# Patient Record
Sex: Female | Born: 1950 | Race: White | Hispanic: No | Marital: Married | Smoking: Never smoker
Health system: Southern US, Community
[De-identification: ages and names within clinical notes are randomized; demographics above are authoritative.]

## PROBLEM LIST (undated history)

## (undated) DIAGNOSIS — IMO0002 Reserved for concepts with insufficient information to code with codable children: Secondary | ICD-10-CM

## (undated) DIAGNOSIS — Z889 Allergy status to unspecified drugs, medicaments and biological substances status: Secondary | ICD-10-CM

## (undated) DIAGNOSIS — Z8669 Personal history of other diseases of the nervous system and sense organs: Secondary | ICD-10-CM

## (undated) DIAGNOSIS — C801 Malignant (primary) neoplasm, unspecified: Secondary | ICD-10-CM

## (undated) DIAGNOSIS — T7840XA Allergy, unspecified, initial encounter: Secondary | ICD-10-CM

## (undated) DIAGNOSIS — K5790 Diverticulosis of intestine, part unspecified, without perforation or abscess without bleeding: Secondary | ICD-10-CM

## (undated) DIAGNOSIS — M1990 Unspecified osteoarthritis, unspecified site: Secondary | ICD-10-CM

## (undated) HISTORY — PX: TOTAL ABDOMINAL HYSTERECTOMY W/ BILATERAL SALPINGOOPHORECTOMY: SHX83

## (undated) HISTORY — PX: ABDOMINAL HYSTERECTOMY: SHX81

## (undated) HISTORY — PX: JOINT REPLACEMENT: SHX530

## (undated) HISTORY — PX: KNEE ARTHROSCOPY: SHX127

## (undated) HISTORY — DX: Allergy, unspecified, initial encounter: T78.40XA

## (undated) HISTORY — DX: Diverticulosis of intestine, part unspecified, without perforation or abscess without bleeding: K57.90

---

## 1997-04-26 ENCOUNTER — Inpatient Hospital Stay (HOSPITAL_COMMUNITY): Admission: AD | Admit: 1997-04-26 | Discharge: 1997-04-28 | Payer: Self-pay | Admitting: Obstetrics and Gynecology

## 1999-02-08 ENCOUNTER — Other Ambulatory Visit: Admission: RE | Admit: 1999-02-08 | Discharge: 1999-02-08 | Payer: Self-pay

## 1999-02-08 ENCOUNTER — Other Ambulatory Visit: Admission: RE | Admit: 1999-02-08 | Discharge: 1999-02-08 | Payer: Self-pay | Admitting: Obstetrics and Gynecology

## 1999-03-01 ENCOUNTER — Encounter: Payer: Self-pay | Admitting: Obstetrics and Gynecology

## 1999-03-06 ENCOUNTER — Encounter (INDEPENDENT_AMBULATORY_CARE_PROVIDER_SITE_OTHER): Payer: Self-pay | Admitting: Specialist

## 1999-03-06 ENCOUNTER — Inpatient Hospital Stay (HOSPITAL_COMMUNITY): Admission: RE | Admit: 1999-03-06 | Discharge: 1999-03-08 | Payer: Self-pay | Admitting: Obstetrics and Gynecology

## 1999-04-29 ENCOUNTER — Encounter: Payer: Self-pay | Admitting: Obstetrics and Gynecology

## 1999-04-29 ENCOUNTER — Ambulatory Visit (HOSPITAL_COMMUNITY): Admission: RE | Admit: 1999-04-29 | Discharge: 1999-04-29 | Payer: Self-pay | Admitting: Obstetrics and Gynecology

## 1999-05-21 ENCOUNTER — Ambulatory Visit (HOSPITAL_COMMUNITY): Admission: RE | Admit: 1999-05-21 | Discharge: 1999-05-21 | Payer: Self-pay | Admitting: Internal Medicine

## 1999-06-11 ENCOUNTER — Ambulatory Visit: Admission: RE | Admit: 1999-06-11 | Discharge: 1999-06-11 | Payer: Self-pay | Admitting: Gynecology

## 1999-12-09 ENCOUNTER — Other Ambulatory Visit: Admission: RE | Admit: 1999-12-09 | Discharge: 1999-12-09 | Payer: Self-pay | Admitting: Obstetrics and Gynecology

## 2000-02-13 ENCOUNTER — Encounter: Payer: Self-pay | Admitting: Internal Medicine

## 2000-02-13 ENCOUNTER — Encounter: Admission: RE | Admit: 2000-02-13 | Discharge: 2000-02-13 | Payer: Self-pay | Admitting: Internal Medicine

## 2000-05-28 ENCOUNTER — Other Ambulatory Visit: Admission: RE | Admit: 2000-05-28 | Discharge: 2000-05-28 | Payer: Self-pay | Admitting: Obstetrics and Gynecology

## 2001-06-29 ENCOUNTER — Other Ambulatory Visit: Admission: RE | Admit: 2001-06-29 | Discharge: 2001-06-29 | Payer: Self-pay | Admitting: Obstetrics and Gynecology

## 2002-07-04 ENCOUNTER — Other Ambulatory Visit: Admission: RE | Admit: 2002-07-04 | Discharge: 2002-07-04 | Payer: Self-pay | Admitting: Obstetrics and Gynecology

## 2003-05-11 ENCOUNTER — Encounter: Admission: RE | Admit: 2003-05-11 | Discharge: 2003-05-11 | Payer: Self-pay | Admitting: Internal Medicine

## 2003-11-13 ENCOUNTER — Ambulatory Visit (HOSPITAL_COMMUNITY): Admission: RE | Admit: 2003-11-13 | Discharge: 2003-11-13 | Payer: Self-pay | Admitting: Gastroenterology

## 2005-05-19 ENCOUNTER — Ambulatory Visit (HOSPITAL_BASED_OUTPATIENT_CLINIC_OR_DEPARTMENT_OTHER): Admission: RE | Admit: 2005-05-19 | Discharge: 2005-05-19 | Payer: Self-pay | Admitting: Orthopedic Surgery

## 2006-11-05 ENCOUNTER — Encounter: Admission: RE | Admit: 2006-11-05 | Discharge: 2006-11-05 | Payer: Self-pay | Admitting: *Deleted

## 2006-11-08 ENCOUNTER — Encounter: Admission: RE | Admit: 2006-11-08 | Discharge: 2006-11-08 | Payer: Self-pay | Admitting: *Deleted

## 2006-12-24 ENCOUNTER — Encounter: Admission: RE | Admit: 2006-12-24 | Discharge: 2006-12-24 | Payer: Self-pay | Admitting: Orthopedic Surgery

## 2007-06-16 ENCOUNTER — Inpatient Hospital Stay (HOSPITAL_COMMUNITY): Admission: RE | Admit: 2007-06-16 | Discharge: 2007-06-19 | Payer: Self-pay | Admitting: Orthopedic Surgery

## 2007-06-16 IMAGING — CR DG HIP 1V*R*
1 series · 1 of 1 positions shown · non-contrast
Comparison: [DATE]

CLINICAL DATA: 56-year-old female.  Status post right hip total
arthroplasty.

RIGHT HIP - 1 VIEW

[view not recorded]
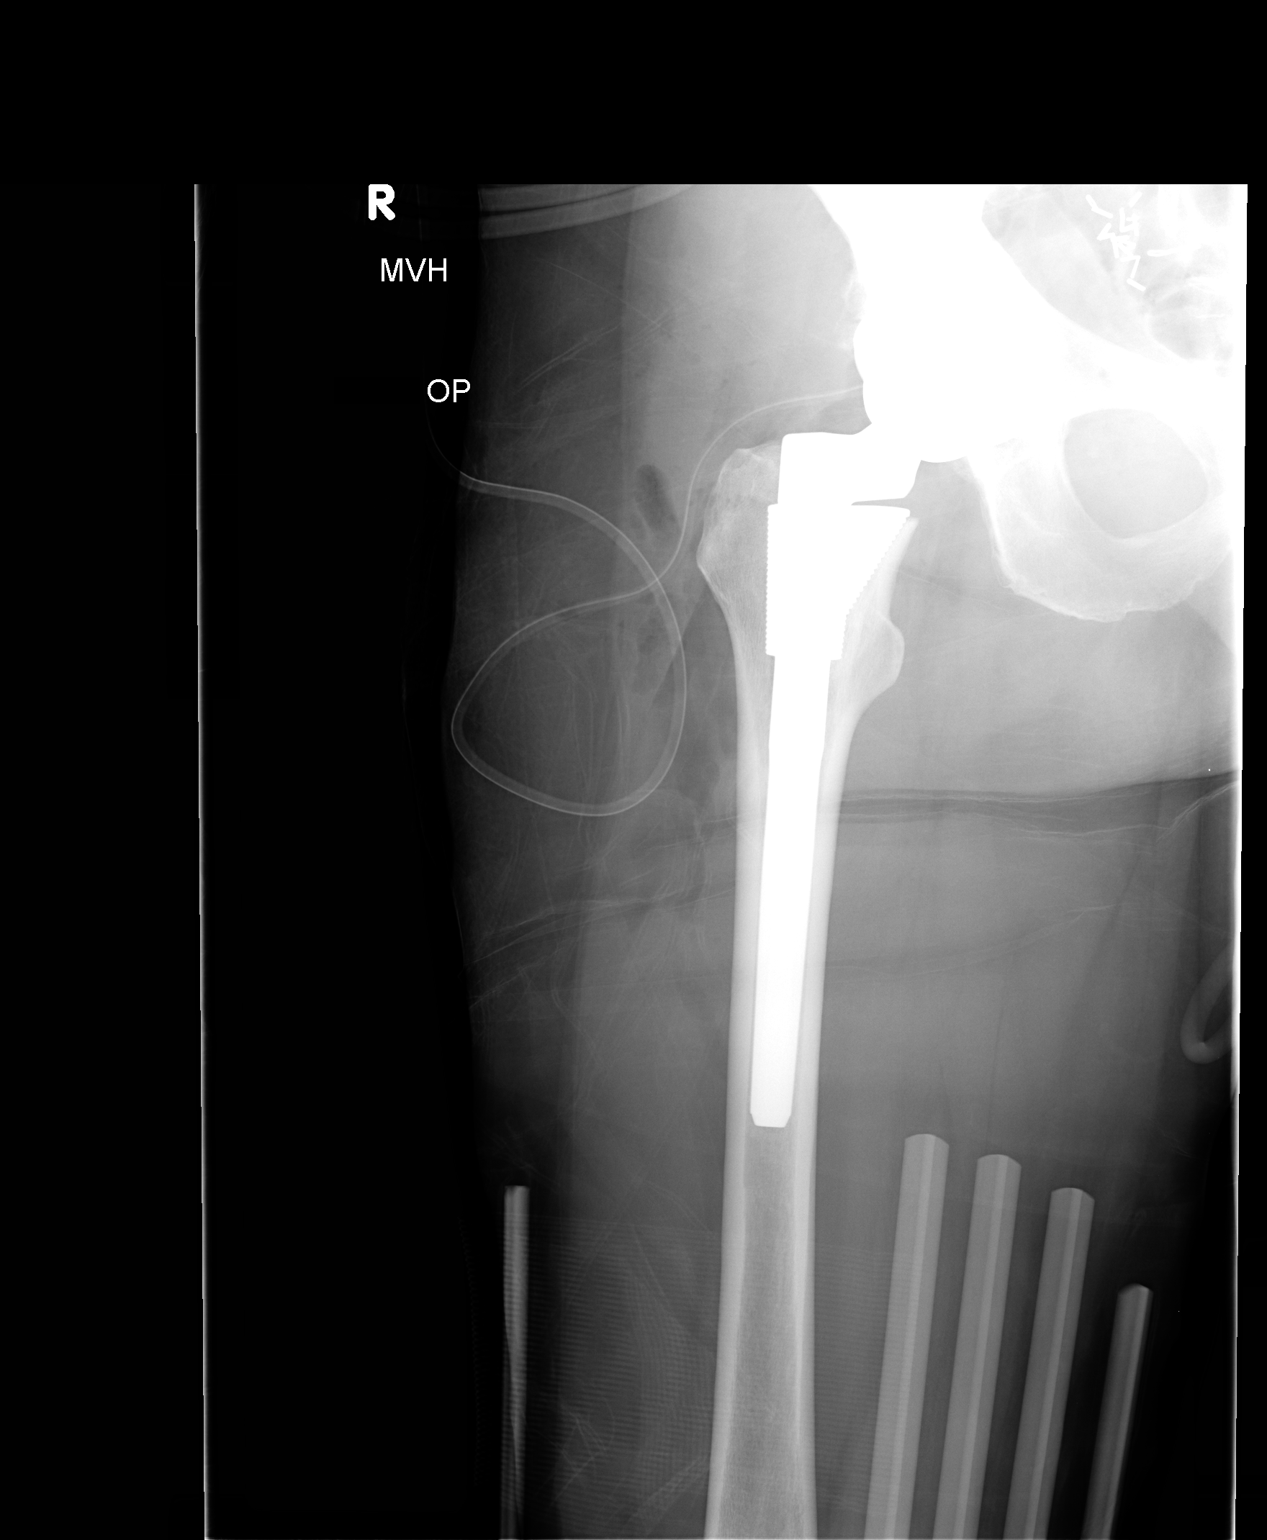

[1 of 1 positions shown; findings below may reference images not displayed]

FINDINGS: Single AP view the right hip demonstrates the patient is
status post right total hip arthroplasty with a noncemented
acetabular component.  Surgical drain is in place.  There is no
radiographic evidence for complication.
IMPRESSION: 1.  Status post right total hip arthroplasty without radiographic
evidence for complication.

## 2008-06-13 ENCOUNTER — Encounter: Admission: RE | Admit: 2008-06-13 | Discharge: 2008-06-13 | Payer: Self-pay | Admitting: Family Medicine

## 2010-05-28 NOTE — H&P (Signed)
[name], [name] NO.:  [id number]   MEDICAL RECORD NO.:  [id number]          PATIENT TYPE:  INP   LOCATION:  NA                           FACILITY:  [facility]   PHYSICIAN:  Ollen Gross, M.D.    DATE OF BIRTH:  [birth year]   DATE OF ADMISSION:  06/16/2007  DATE OF DISCHARGE:                              HISTORY & PHYSICAL   DATE OF OFFICE VISIT HISTORY AND PHYSICAL:  Performed on May 20, 2007.   CHIEF COMPLAINT:  Right hip pain.   HISTORY OF PRESENT ILLNESS:  The patient is a 60 year old female who has  seen by Dr. Lequita Halt for ongoing right hip pain.  She has known  significant arthritis and has been followed for some time now.  She has  known bone-on-bone arthritis with cystic changes in the head and  acetabulum.  She has undergone intra-articular injections but only  provided temporary relief.  She is at a point now where she is having  increasing pain felt to be a good candidate for hip replacement.  Risks  and benefits have been discussed.  She elects to proceed with surgery.   ALLERGIES:  NO KNOWN DRUG ALLERGIES.   INTOLERANCES:  1. FENTANYL causes nausea.  2. PERCOCET causes nausea.   CURRENT MEDICATIONS:  1. Flonase.  2. Astelin.  3. Vivelle dot.  4. Vicodin.   PAST MEDICAL HISTORY:  1. Postmenopausal.  2. Hip arthritis.   PAST SURGICAL HISTORY:  1. Oophorectomy in 1999.  2. Hysterectomy in 2001.  3. Meniscus surgery in 2007.   SOCIAL HISTORY:  Married.  Self-employed Engineer, agricultural.  Nonsmoker.  One  and a half glasses of wine daily.  Family will be assisting with care  after surgery.   REVIEW OF SYSTEMS:  GENERAL:  No fevers, chills, night sweats.  NEUROLOGIC:  No seizures, syncope or paralysis.  RESPIRATORY:  No  shortness breath, productive cough or hemoptysis.  CARDIOVASCULAR:  No  chest pain, angina, orthopnea.  GI:  No nausea, vomiting or diarrhea.  She does have some constipation.  GU:  No dysuria, hematuria or  discharge.   MUSCULOSKELETAL:  Right hip.   PHYSICAL EXAMINATION:  VITAL SIGNS:  Pulse 68, respirations 14, blood  pressure 112/72.  GENERAL:  A 61 year old white female, well-nourished, well-developed, in  no acute distress.  She is alert, oriented and cooperative.  HEENT:  Normocephalic, atraumatic.  Pupils are round and reactive.  Oropharynx is clear.  Extraocular movements intact.  NECK:  Supple.  CHEST:  Clear.  HEART:  Regular rate and rhythm.  ABDOMEN:  Soft, nontender.  Bowel sounds present.  RECTAL/BREAST/GENITALIA:  Not done.  Not pertinent to present illness.  EXTREMITIES:  Right hip flexion 95, zero internal rotation, 10 degrees  external rotation, 20 degrees abduction.   IMPRESSION:  Osteoarthritis, right hip.   PLAN:  The patient admitted to [facility] to undergo a right  total hip replacement arthroplasty.  Surgery will be performed by Dr.  Ollen Gross.      Alexzandrew L. Perkins, P.A.C.      Ollen Gross, M.D.  Electronically Signed    ALP/MEDQ  D:  06/15/2007  T:  06/16/2007  Job:  [id number]   cc:   Georgena Spurling, [facility]

## 2010-05-28 NOTE — Op Note (Signed)
NAMEKATELYNNE, REVAK NO.:  [id number]   MEDICAL RECORD NO.:  [id number]          PATIENT TYPE:  INP   LOCATION:  1614                         FACILITY:  [facility]   PHYSICIAN:  Ollen Gross, M.D.    DATE OF BIRTH:  27-May-1950   DATE OF PROCEDURE:  06/16/2007  DATE OF DISCHARGE:                               OPERATIVE REPORT   PREOPERATIVE DIAGNOSIS:  Osteoarthritis with acetabular dysplasia, right  hip.   POSTOPERATIVE DIAGNOSIS:  Osteoarthritis with acetabular dysplasia,  right hip.   PROCEDURE:  Right total hip arthroplasty.   SURGEON:  Ollen Gross, M.D.   ASSISTANT:  Avel Peace, P.A.-C.   ANESTHESIA:  Spinal with Duramorph.   ESTIMATED BLOOD LOSS:  100.   DRAINS:  Hemovac x1.   COMPLICATIONS:  None.   CONDITION:  Stable to recovery.   BRIEF CLINICAL NOTE:  Ms. Milner is a 60 year old female who has severe  arthritic change of the right hip with underlying acetabular femoral  head dysplasia.  She has had long-term worsening of her condition and  presents now for total hip arthroplasty.   PROCEDURE IN DETAIL:  After successful administration of spinal  anesthetic, the patient is placed in left lateral decubitus position  with the right side up and held the hip positioner.  The right lower  extremity was isolated from the perineum with plastic drapes and prepped  and draped in the usual sterile fashion.  Short posterolateral incision  was made with 10 blade through the subcutaneous tissue to the level of  fascia lata which was incised in line with the skin incision.  The  sciatic nerve was palpated and protected, and short external rotator was  isolated off the femur.  Capsulectomy was performed, and the hip was  dislocated.  The center of femoral head was marked, and trial prosthesis  was placed such that the center of the trial head corresponded to the  center of her native femoral head.  Osteotomy line was marked on the  femoral neck, and  osteotomy made with an oscillating saw.  Femoral head  was removed, and the femur retracted anteriorly to gain acetabular  exposure.   Acetabular retractors were placed and labrum and osteophytes removed.  Reaming starts at 43 mm coursing in increments of 2 to 49 mm, and then a  50-mm pinnacle acetabular shell was placed in anatomic position with  excellent purchase.  The apex hole eliminator was placed, and then the  36-mm neutral Ultamet metal liner was placed for a metal-on-metal hip  replacement.   The femur was prepared the canal finder and irrigation.  Axial reaming  was performed up to 13.5 mm, proximal reaming to 18B, and the sleeve  machined to a large.  An 18D large trial sleeve was placed with 18 x 13  stem and 36 plus 8 neck about 10 degrees beyond her native anteversion.  The trial 36 plus 0 head was placed, and the hip was reduced with  outstanding stability.  There was full extension, full external  rotation, 70 degrees of flexion, 40 degrees of adduction and  90 degrees  of internal rotation and 90 degrees of flexion and 70 degrees of  internal rotation.  By placing the right leg on top of the left, it felt  as though the leg lengths were equal.  The hip was then dislocated and  trials removed.  A permanent 18D large sleeve was placed with the 18 x  13 stem and 36 plus 8, again 10 degrees beyond native anteversion.  A 36  plus 0 head was placed, and the hip was reduced with the same stability  parameters.  The wound was copiously irrigated with saline solution and  the short rotators reattached to the femur through drill holes.  Fascia  was closed over a Hemovac drain with interrupted #1 Vicryl.  Subcutaneous tissue closed with #1-0 and #2-0 Vicryl and subcuticular  running 4-0 Monocryl.  The drain was hooked to suction.  Incision was  cleaned and dried, and Steri-Strips and a bulky sterile dressing  applied.  She was then placed into a knee immobilizer, awakened and   transported to recovery in stable condition.      Ollen Gross, M.D.  Electronically Signed     FA/MEDQ  D:  06/16/2007  T:  06/16/2007  Job:  [id number]

## 2010-05-31 NOTE — Discharge Summary (Signed)
NAMEKIEANNA, ROLLO                ACCOUNT NO.:  [id number]   MEDICAL RECORD NO.:  [id number]          PATIENT TYPE:  INP   LOCATION:  1614                         FACILITY:  [facility]   PHYSICIAN:  Ollen Gross, M.D.    DATE OF BIRTH:  21-Feb-1950   DATE OF ADMISSION:  06/16/2007  DATE OF DISCHARGE:  06/19/2007                               DISCHARGE SUMMARY   ADMITTING DIAGNOSES:  1. Osteoarthritis, right hip.  2. Postmenopausal.   DISCHARGE DIAGNOSES:  1. Osteoarthritis, right hip, with acetabular dysplasia.  2. Postmenopausal.   PROCEDURE:  June 16, 2007 right total hip.  Surgeon - Dr. Lequita Halt.  Assistant - Avel Peace, P.A.C.  Spinal anesthesia with Duramorph.   CONSULTATIONS:  None.   BRIEF HISTORY:  Ms. Palecek is a 60 year old female with severe end-stage  arthritis of the right hip with progressive worsening pain and  dysfunction.  She had some underlying acetabular femoral head dysplasia,  worsening symptoms, who now presents for a total hip.   LABORATORY DATA:  Preoperative CBC showed a hemoglobin of 13.5,  hematocrit 39.5, white cell count 4.0, platelets 325.  Postoperative  hemoglobin 12.3, drifted down to 11, back up to 11.2 with a hematemesis  of 32.5.  PT/PTT preoperatively 13.1 and 30 respectively.  INR 1.0.  Serial pro times followed.  PT/INR 19.2 and 1.6.  Chem panel on  admission minimally elevated CO2 of 33.  Remaining Chem panel within  normal limits.  Serial BMETs were followed.  Electrolytes remained  within normal limits.  Preoperative UA negative.  Blood group type A+.  EKG on Jun 10, 2007 revealed normal sinus rhythm, normal EKG,  unconfirmed.  Hip film on Jun 10, 2007 stable, asymmetric.  Degenerate  changes, right hip, with prominence of chondral cyst in the right  acetabulum root with no acute osseous findings.   HOSPITAL COURSE:  The patient was admitted to [facility]  Tolerated the procedure well and later transferred to the recovery  room  and the orthopedic floor.  Started on PCA and p.o. analgesic pain  control following surgery.  Hemovac drain placed during surgery was  pulled on day one.  Given 24 hours of postoperative IV antibiotics,  started on Coumadin for DVT prophylaxis, Coumadin protocol.  Actually  doing pretty well on the morning of day one.  Started getting up out of  bed.  By day #2, was a little sore, but still doing pretty well on pain  control.  Hemoglobin was 11.  Dressing changed; the incision looked  good.  Had excellent output.  From a therapy standpoint, she started  getting up, walking short distances and continued to improve; by day #3  walking over 60 feet.  Had been weaned over to p.m. medications.  Arrangements were made for home.  The wound was healing well, and the  patient was discharged home later that day.   DISCHARGE/PLAN:  1. The patient was discharged home on June 19, 2007.  2. Discharge diagnoses - please see above.  3. Discharge medications - Percocet, Robaxin, Coumadin.   ACTIVITY:  Partial weightbearing  25-50%, right lower extremity.  Home  health PT, home health nursing, total hip protocol with hip precautions.   FOLLOWUP:  Follow up on June 29, 2007 on Tuesday; call for appointment.   DISPOSITION:  Home.   CONDITION ON DISCHARGE:  Improving.      Alexzandrew L. Perkins, P.A.C.      Ollen Gross, M.D.  Electronically Signed    ALP/MEDQ  D:  07/14/2007  T:  07/14/2007  Job:  [id number]   cc:   Simeon Craft(?), N.P.  [facility]

## 2010-05-31 NOTE — Op Note (Signed)
Martha, Garcia NO.:  [id number]   MEDICAL RECORD NO.:  [id number]          PATIENT TYPE:  AMB   LOCATION:  ENDO                         FACILITY:  [facility]   PHYSICIAN:  Graylin Shiver, M.D.   DATE OF BIRTH:  September 30, 1950   DATE OF PROCEDURE:  DATE OF DISCHARGE:  11/13/2003                                 OPERATIVE REPORT   PROCEDURE:  Colonoscopy.   ENDOSCOPIST:  Graylin Shiver, M.D.   INDICATIONS:  Screening.   INFORMED CONSENT:  Informed consent was obtained after explanation of the  risks of bleeding, infection and perforation.   PREMEDICATION:  Fentanyl 50 mcg IV, Versed 6 mg IV.   DESCRIPTION OF PROCEDURE:  With the patient in the left lateral decubitus  position, a rectal exam was performed and no masses were felt.  The Olympus  colonoscope was inserted into the rectum and advanced around the colon to  the cecum.  The cecal landmarks were identified.  The cecum and ascending  colon were normal.  The transverse colon was normal.  The descending colon,  sigmoid and rectum were normal.  She tolerated the procedure well without  complications.   IMPRESSION:  Normal colonoscopy to the cecum.   RECOMMENDATIONS:  I recommend a followup screening colonoscopy again in 10  years.      SFG/MEDQ  D:  11/13/2003  T:  11/13/2003  Job:  [id number]   cc:   Darius Bump, M.D.  [place]  [place] [address]  Fax: [phone]

## 2010-05-31 NOTE — Consult Note (Signed)
[facility]  Patient:    [name], [name]                       MRN: [id number] Proc. Date: 06/11/99 Adm. Date:  [id number] Attending:  Jeannette Corpus CC:         Eliberto Ivory. Rosalio Macadamia, M.D.             Pershing Cox, M.D.             Telford Nab, R.N.                          Consultation Report  HISTORY OF PRESENT ILLNESS:  A 60 year old white female referred by Dr. Gildardo Griffes and Floyde Parkins for a second opinion regarding management of an unusual pelvic tumor. The patients history is outlined in detail and Dr. Theodosia Blender consultation note of April 24. Basically, the patient had been followed with a relatively small (4 cm) pelvic cyst which is thought to be adnexal in origin. She ultimately underwent surgery on April 21 with the finding of a left adnexal mass measuring 3.1 x 4.2 cm. The patient underwent a left salpingo-oophorectomy, the left tube and ovary of which were stuck to the posterior wall of the uterus and peritoneum. 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 The rectum was also adherent to the posterior surface of the uterus requiring extensive dissection. She underwent a supracervical hysterectomy. Subsequently a mass was found deeper in the pelvis in the left side. Left ureterolysis was required to protect the ureter and free the mass from what sounds like a perirectal space. 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  The pathology had been reviewed by Dr. Ursula Beath at [facility].  The patient seeks consultation in helping to develop and understanding of the etiology of this mass. We also discussed that there is probably a low recurrence rate if we can extrapolate from borderline tumors of the ovary. Given this borderline nature, I do not believe additional surgical staging would be of any significant  value. I indicated, I agreed with Dr. Theodosia Blender recommendation to obtain a pelvic ultrasound once a year and have twice a year pelvic examinations. (The patients had an MRI on April 16 which was normal and may be used as a baseline although I would not routinely plan follow-up MRIs unless she had specific symptomatology to evaluate.)  The pros and cons of hormone replacement therapy were discussed and I am concurrence with the idea of continuing the patient on Estratest. We discussed the pros and cons of chemotherapy if she were to have a recurrence and I indicated that surgical resection would be the treatment of choice in this setting of a borderline tumor. She has been given warning signs regarding potential symptoms of recurrence. All of her questions are answered. She will return to the care of Dr. Rosalio Macadamia. DD:  06/11/99 TD:  06/11/99 Job: [id number]

## 2010-05-31 NOTE — Op Note (Signed)
[name], [name]                ACCOUNT NO.:  [id number]   MEDICAL RECORD NO.:  [id number]          PATIENT TYPE:  AMB   LOCATION:  DSC                          FACILITY:  [facility]   PHYSICIAN:  Robert A. Thurston Hole, M.D. DATE OF BIRTH:  28-Sep-1950   DATE OF PROCEDURE:  05/19/2005  DATE OF DISCHARGE:                                 OPERATIVE REPORT   PREOPERATIVE DIAGNOSIS:  Left knee medial and lateral meniscal tears with  chondromalacia and synovitis.   POSTOPERATIVE DIAGNOSIS:  Left knee medial and lateral meniscal tears with  chondromalacia and synovitis.   PROCEDURE:  1.  Left knee exam under anesthesia followed by arthroscopic partial medial      and lateral meniscectomies.  2.  Left knee chondroplasty with partial synovectomy.   SURGEON:  Elana Alm. Thurston Hole, M.D.   ASSISTANT:  Julien Girt, P.A.   ANESTHESIA:  Local and MAC.   OPERATIVE TIME:  30 minutes.   COMPLICATIONS:  None.   INDICATIONS FOR PROCEDURE:  [name] is a 54-year woman who has had a  twisting injury to her left knee approximately three months ago with  increased pain and swelling with exam and MRI documenting medial and lateral  meniscal tears with chondromalacia and synovitis.  She failed conservative  care and is now to undergo arthroscopy.   DESCRIPTION:  [name] was brought to operating room on May 19, 2005 after a  knee block was placed in the holding room by anesthesia.  She was placed on  the operative table in supine position.  Her left knee was examined under  anesthesia.  She had full range of motion, and her knee was stable  ligamentous exam with normal patellar tracking.  Left leg was prepped using  sterile DuraPrep and draped using sterile technique.  Originally, through an  anterolateral portal, the arthroscope with a pump attached was placed into  an anteromedial portal, and an arthroscopic probe was placed.  On initial  inspection of the medial compartment, she had 30% grade 3  chondromalacia  which was debrided.  Medial meniscus tear of posteromedial and medial horn  of which 40-50% was resected back to stable rim.  Intercondylar notch  inspected and anterior and posterior cruciate ligaments were normal.  Lateral compartment inspected. Articular cartilage showed mild grade 1 to 2  chondromalacia.  Lateral meniscus tear posterior and lateral horn 25 to 30%  which was resected back to stable rim.  Patellofemoral joint and articular  cartilage normal.  The patella tracked normally.  Moderate synovitis in the  medial and lateral gutters were debrided.  Otherwise, they are free of  pathology.  After this was done, it was felt that all pathology had been  satisfactorily addressed.  Instruments were removed.  Portals were closed  with 3-0 nylon suture and injected with 0.2% Marcaine with epinephrine and 4  mg of morphine.  Sterile dressings applied.  The patient awakened and taken  to recovery in stable condition.   FOLLOW-UP CARE:  [name] will be followed as an outpatient on Percocet and  ibuprofen.  She will  be back in the office in a week for sutures out and  follow-up.      Robert A. Thurston Hole, M.D.  Electronically Signed     RAW/MEDQ  D:  05/19/2005  T:  05/20/2005  Job:  [id number]

## 2010-10-09 LAB — COMPREHENSIVE METABOLIC PANEL
AST: 21
Albumin: 3.7
BUN: 11
Calcium: 9.6
GFR calc Af Amer: >60
GFR calc non Af Amer: >60
Potassium: 4.7
Total Bilirubin: 0.8
Total Protein: 6.7

## 2010-10-09 LAB — PROTIME-INR
INR: 1
Prothrombin Time: 13.1

## 2010-10-09 LAB — URINALYSIS, ROUTINE W REFLEX MICROSCOPIC: Hgb urine dipstick: NEGATIVE

## 2010-10-09 LAB — CBC
MCHC: 34.2
Platelets: 325
RBC: 4.21

## 2010-10-10 LAB — BASIC METABOLIC PANEL
BUN: 4 — ABNORMAL LOW
BUN: 7
CO2: 29
CO2: 31
Calcium: 8.6
Calcium: 8.9
Chloride: 104
Creatinine, Ser: 0.77
GFR calc non Af Amer: >60
GFR calc non Af Amer: >60
Sodium: 137

## 2010-10-10 LAB — CBC
HCT: 32.5 — ABNORMAL LOW
Hemoglobin: 11.2 — ABNORMAL LOW
MCHC: 34.5
MCHC: 34.6
MCV: 94.1
MCV: 95.2
Platelets: 212
Platelets: 223
RBC: 3.41 — ABNORMAL LOW
RBC: 3.46 — ABNORMAL LOW
RBC: 3.8 — ABNORMAL LOW
RDW: 12.2
RDW: 13

## 2010-10-10 LAB — PROTIME-INR
INR: 1.1
INR: 1.5
INR: 1.6 — ABNORMAL HIGH
Prothrombin Time: 14.3
Prothrombin Time: 19.2 — ABNORMAL HIGH

## 2010-10-10 LAB — TYPE AND SCREEN: ABO/RH(D): A POS

## 2013-04-22 ENCOUNTER — Encounter: Payer: Self-pay | Admitting: Internal Medicine

## 2013-06-17 ENCOUNTER — Other Ambulatory Visit: Payer: Self-pay | Admitting: Orthopedic Surgery

## 2013-06-22 ENCOUNTER — Encounter: Payer: Self-pay | Admitting: Internal Medicine

## 2013-06-24 ENCOUNTER — Encounter (HOSPITAL_COMMUNITY): Payer: Self-pay | Admitting: Pharmacy Technician

## 2013-06-29 ENCOUNTER — Ambulatory Visit (HOSPITAL_COMMUNITY)
Admission: RE | Admit: 2013-06-29 | Discharge: 2013-06-29 | Disposition: A | Discharge status: Home / Self Care | Payer: 59 | Source: Ambulatory Visit | Attending: Orthopedic Surgery | Admitting: Orthopedic Surgery

## 2013-06-29 ENCOUNTER — Encounter (HOSPITAL_COMMUNITY)
Admission: RE | Admit: 2013-06-29 | Discharge: 2013-06-29 | Disposition: A | Discharge status: Home / Self Care | Payer: 59 | Source: Ambulatory Visit | Attending: Orthopedic Surgery | Admitting: Orthopedic Surgery

## 2013-06-29 ENCOUNTER — Encounter (HOSPITAL_COMMUNITY): Payer: Self-pay

## 2013-06-29 DIAGNOSIS — Y831 Surgical operation with implant of artificial internal device as the cause of abnormal reaction of the patient, or of later complication, without mention of misadventure at the time of the procedure: Secondary | ICD-10-CM | POA: Insufficient documentation

## 2013-06-29 DIAGNOSIS — T84099A Other mechanical complication of unspecified internal joint prosthesis, initial encounter: Secondary | ICD-10-CM | POA: Insufficient documentation

## 2013-06-29 HISTORY — DX: Personal history of other diseases of the nervous system and sense organs: Z86.69

## 2013-06-29 HISTORY — DX: Unspecified osteoarthritis, unspecified site: M19.90

## 2013-06-29 HISTORY — DX: Allergy status to unspecified drugs, medicaments and biological substances: Z88.9

## 2013-06-29 HISTORY — DX: Malignant (primary) neoplasm, unspecified: C80.1

## 2013-06-29 HISTORY — DX: Reserved for concepts with insufficient information to code with codable children: IMO0002

## 2013-06-29 LAB — URINE MICROSCOPIC-ADD ON

## 2013-06-29 LAB — COMPREHENSIVE METABOLIC PANEL
ALBUMIN: 4.1 g/dL (ref 3.5–5.2)
ALT: 15 U/L (ref 0–35)
AST: 24 U/L (ref 0–37)
Alkaline Phosphatase: 72 U/L (ref 39–117)
BUN: 13 mg/dL (ref 6–23)
CO2: 28 mEq/L (ref 19–32)
CREATININE: 0.75 mg/dL (ref 0.50–1.10)
Calcium: 10 mg/dL (ref 8.4–10.5)
Chloride: 102 mEq/L (ref 96–112)
GFR calc Af Amer: >90 mL/min (ref >90)
GFR calc non Af Amer: >90 mL/min (ref >90)
Glucose, Bld: 94 mg/dL (ref 70–99)
Potassium: 4.5 mEq/L (ref 3.7–5.3)
Sodium: 142 mEq/L (ref 137–147)
Total Bilirubin: 0.7 mg/dL (ref 0.3–1.2)
Total Protein: 7.6 g/dL (ref 6.0–8.3)

## 2013-06-29 LAB — URINALYSIS, ROUTINE W REFLEX MICROSCOPIC
Bilirubin Urine: NEGATIVE
Glucose, UA: NEGATIVE mg/dL
Ketones, ur: NEGATIVE mg/dL
Leukocytes, UA: NEGATIVE
Nitrite: NEGATIVE
Protein, ur: NEGATIVE mg/dL
SPECIFIC GRAVITY, URINE: 1.011 (ref 1.005–1.030)
Urobilinogen, UA: 0.2 mg/dL (ref 0.0–1.0)
pH: 5.5 (ref 5.0–8.0)

## 2013-06-29 LAB — SURGICAL PCR SCREEN
MRSA, PCR: NEGATIVE
Staphylococcus aureus: POSITIVE — AB

## 2013-06-29 LAB — PROTIME-INR
INR: 1.04 (ref 0.00–1.49)
PROTHROMBIN TIME: 13.4 s (ref 11.6–15.2)

## 2013-06-29 LAB — APTT: aPTT: 30 seconds (ref 24–37)

## 2013-06-29 NOTE — Patient Instructions (Addendum)
[place]  06/29/2013   Your procedure is scheduled on:  6-22 -2015  Enter through [facility] Entrance and follow signs to [facility]. Arrive at      0930  AM .  Call this number if you have problems the morning of surgery: [phone]  Or Presurgical Testing [phone]([name]) For Living Will and/or Health Care Power Attorney Forms: please provide copy for your medical record,may bring AM of surgery(Forms should be already notarized -we do not provide this service).(06-29-13 Yes, thanks for providing records Of Living Will/ HCPOA forms today).  Do not eat food:After Midnight.  May have clear liquids:up to 6 Hours before arrival. Nothing after : 0630  Clear liquids include soda, tea, black coffee, apple or grape juice, broth.  Take these medicines the morning of surgery with A SIP OF WATER: Zyrtec. Hydrocodone. Use/ bring nasal spray(do not use spray while using Mupirocin ointment).   Do not wear jewelry, make-up or nail polish.  Do not wear lotions, powders, or perfumes. You may wear deodorant.  Do not shave 48 hours(2 days) prior to first CHG shower(legs and under arms).(Shaving face and neck okay.)  Do not bring valuables to the hospital.(Hospital is not responsible for lost valuables).  Contacts, dentures or removable bridgework, body piercing, hair pins may not be worn into surgery.  Leave suitcase in the car. After surgery it may be brought to your room.  For patients admitted to the hospital, checkout time is 11:00 AM the day of discharge.(Restricted visitors-Any Persons displaying flu-like symptoms or illness).    Patients discharged the day of surgery will not be allowed to drive home. Must have responsible person with you x 24 hours once discharged.  Name and phone number of your driver: [name] -[phone] cell  Special Instructions: CHG(Chlorhedine 4%-"Hibiclens","Betasept","Aplicare") Shower Use Special Wash: see special  instructions.(avoid face and genitals)   Please read over the following fact sheets that you were given: MRSA Information, Blood Transfusion fact sheet, Incentive Spirometry Instruction.  Remember : Type/Screen "Blue armbands" - may not be removed once applied(would result in being retested AM of surgery, if removed).  Failure to follow these instructions may result in Cancellation of your surgery.   ___________    [facility] - Preparing for Surgery Before surgery, you can play an important role.  Because skin is not sterile, your skin needs to be as free of germs as possible.  You can reduce the number of germs on your skin by washing with CHG (chlorahexidine gluconate) soap before surgery.  CHG is an antiseptic cleaner which kills germs and bonds with the skin to continue killing germs even after washing. Please DO NOT use if you have an allergy to CHG or antibacterial soaps.  If your skin becomes reddened/irritated stop using the CHG and inform your nurse when you arrive at Short Stay. Do not shave (including legs and underarms) for at least 48 hours prior to the first CHG shower.  You may shave your face/neck. Please follow these instructions carefully:  1.  Shower with CHG Soap the night before surgery and the  morning of Surgery.  2.  If you choose to wash your hair, wash your hair first as usual with your  normal  shampoo.  3.  After you shampoo, rinse your hair and body thoroughly to remove the  shampoo.  4.  Use CHG as you would any other liquid soap.  You can apply chg directly  to the skin and wash                       Gently with a scrungie or clean washcloth.  5.  Apply the CHG Soap to your body ONLY FROM THE NECK DOWN.   Do not use on face/ open                           Wound or open sores. Avoid contact with eyes, ears mouth and genitals (private parts).                       Wash face,  Genitals (private parts) with your normal soap.             6.   Wash thoroughly, paying special attention to the area where your surgery  will be performed.  7.  Thoroughly rinse your body with warm water from the neck down.  8.  DO NOT shower/wash with your normal soap after using and rinsing off  the CHG Soap.                9.  Pat yourself dry with a clean towel.            10.  Wear clean pajamas.            11.  Place clean sheets on your bed the night of your first shower and do not  sleep with pets. Day of Surgery : Do not apply any lotions/deodorants the morning of surgery.  Please wear clean clothes to the hospital/surgery center.  FAILURE TO FOLLOW THESE INSTRUCTIONS MAY RESULT IN THE CANCELLATION OF YOUR SURGERY PATIENT SIGNATURE_________________________________  NURSE SIGNATURE__________________________________  ________________________________________________________________________   [name]  An incentive spirometer is a tool that can help keep your lungs clear and active. This tool measures how well you are filling your lungs with each breath. Taking long deep breaths may help reverse or decrease the chance of developing breathing (pulmonary) problems (especially infection) following:  A long period of time when you are unable to move or be active. BEFORE THE PROCEDURE   If the spirometer includes an indicator to show your best effort, your nurse or respiratory therapist will set it to a desired goal.  If possible, sit up straight or lean slightly forward. Try not to slouch.  Hold the incentive spirometer in an upright position. INSTRUCTIONS FOR USE  1. Sit on the edge of your bed if possible, or sit up as far as you can in bed or on a chair. 2. Hold the incentive spirometer in an upright position. 3. Breathe out normally. 4. Place the mouthpiece in your mouth and seal your lips tightly around it. 5. Breathe in slowly and as deeply as possible, raising the piston or the ball toward the top of the column. 6. Hold  your breath for 3-5 seconds or for as long as possible. Allow the piston or ball to fall to the bottom of the column. 7. Remove the mouthpiece from your mouth and breathe out normally. 8. Rest for a few seconds and repeat Steps 1 through 7 at least 10 times every 1-2 hours when you are awake. Take your time and take a few normal breaths between deep breaths. 9. The spirometer may include an indicator to  show your best effort. Use the indicator as a goal to work toward during each repetition. 10. After each set of 10 deep breaths, practice coughing to be sure your lungs are clear. If you have an incision (the cut made at the time of surgery), support your incision when coughing by placing a pillow or rolled up towels firmly against it. Once you are able to get out of bed, walk around indoors and cough well. You may stop using the incentive spirometer when instructed by your caregiver.  RISKS AND COMPLICATIONS  Take your time so you do not get dizzy or light-headed.  If you are in pain, you may need to take or ask for pain medication before doing incentive spirometry. It is harder to take a deep breath if you are having pain. AFTER USE  Rest and breathe slowly and easily.  It can be helpful to keep track of a log of your progress. Your caregiver can provide you with a simple table to help with this. If you are using the spirometer at home, follow these instructions: [place] IF:   You are having difficultly using the spirometer.  You have trouble using the spirometer as often as instructed.  Your pain medication is not giving enough relief while using the spirometer.  You develop fever of 100.5 F (38.1 C) or higher. SEEK IMMEDIATE MEDICAL CARE IF:   You cough up bloody sputum that had not been present before.  You develop fever of 102 F (38.9 C) or greater.  You develop worsening pain at or near the incision site. MAKE SURE YOU:   Understand these instructions.  Will  watch your condition.  Will get help right away if you are not doing well or get worse. Document Released: 05/12/2006 Document Revised: 03/24/2011 Document Reviewed: 07/13/2006 ExitCare Patient Information 2014 ExitCare, [place].   ________________________________________________________________________  WHAT IS A BLOOD TRANSFUSION? Blood Transfusion Information  A transfusion is the replacement of blood or some of its parts. Blood is made up of multiple cells which provide different functions.  Red blood cells carry oxygen and are used for blood loss replacement.  White blood cells fight against infection.  Platelets control bleeding.  Plasma helps clot blood.  Other blood products are available for specialized needs, such as hemophilia or other clotting disorders. BEFORE THE TRANSFUSION  Who gives blood for transfusions?   Healthy volunteers who are fully evaluated to make sure their blood is safe. This is blood bank blood. Transfusion therapy is the safest it has ever been in the practice of medicine. Before blood is taken from a donor, a complete history is taken to make sure that person has no history of diseases nor engages in risky social behavior (examples are intravenous drug use or sexual activity with multiple partners). The donor's travel history is screened to minimize risk of transmitting infections, such as malaria. The donated blood is tested for signs of infectious diseases, such as HIV and hepatitis. The blood is then tested to be sure it is compatible with you in order to minimize the chance of a transfusion reaction. If you or a relative donates blood, this is often done in anticipation of surgery and is not appropriate for emergency situations. It takes many days to process the donated blood. RISKS AND COMPLICATIONS Although transfusion therapy is very safe and saves many lives, the main dangers of transfusion include:   Getting an infectious disease.  Developing a  transfusion reaction. This is an allergic reaction  to something in the blood you were given. Every precaution is taken to prevent this. The decision to have a blood transfusion has been considered carefully by your caregiver before blood is given. Blood is not given unless the benefits outweigh the risks. AFTER THE TRANSFUSION  Right after receiving a blood transfusion, you will usually feel much better and more energetic. This is especially true if your red blood cells have gotten low (anemic). The transfusion raises the level of the red blood cells which carry oxygen, and this usually causes an energy increase.  The nurse administering the transfusion will monitor you carefully for complications. HOME CARE INSTRUCTIONS  No special instructions are needed after a transfusion. You may find your energy is better. Speak with your caregiver about any limitations on activity for underlying diseases you may have. SEEK MEDICAL CARE IF:   Your condition is not improving after your transfusion.  You develop redness or irritation at the intravenous (IV) site. SEEK IMMEDIATE MEDICAL CARE IF:  Any of the following symptoms occur over the next 12 hours:  Shaking chills.  You have a temperature by mouth above 102 F (38.9 C), not controlled by medicine.  Chest, back, or muscle pain.  People around you feel you are not acting correctly or are confused.  Shortness of breath or difficulty breathing.  Dizziness and fainting.  You get a rash or develop hives.  You have a decrease in urine output.  Your urine turns a dark color or changes to pink, red, or brown. Any of the following symptoms occur over the next 10 days:  You have a temperature by mouth above 102 F (38.9 C), not controlled by medicine.  Shortness of breath.  Weakness after normal activity.  The white part of the eye turns yellow (jaundice).  You have a decrease in the amount of urine or are urinating less often.  Your  urine turns a dark color or changes to pink, red, or brown. Document Released: 12/28/1999 Document Revised: 03/24/2011 Document Reviewed: 08/16/2007 [facility] Patient Information 2014 [place], [place].  _______________________________________________________________________

## 2013-06-29 NOTE — Pre-Procedure Instructions (Addendum)
R. Hip Xray today.  EKG 06-14-13 report with chart.,labs CBC,BMP,TSH,Urine(no micro). 06-29-13 1545 PCR Positive for Staph aureus- Rx. For Mupirocin called to [place]. [phone] pt. made aware.

## 2013-07-01 NOTE — Progress Notes (Signed)
Patient was notified of OR time change and to come to short stay at 0830 for 1130 surgery

## 2013-07-03 ENCOUNTER — Other Ambulatory Visit: Payer: Self-pay | Admitting: Orthopedic Surgery

## 2013-07-03 NOTE — H&P (Signed)
[name] DOB: Apr 23, 1950 Married / Language: English / Race: White Female  Date of Admission:  07-04-2013  Chief Complaint:  Failed Right Hip Bearing Surface  History of Present Illness The patient is a 63 year old female who comes in for a preoperative History and Physical. The patient is scheduled for a right head and liner exchange to be performed by Dr. Dione Plover. Aluisio, MD at [facility] on 07-04-2013. The patient is a 63 year old female presenting for a post-operative visit. The patient comes in today 6 years out from right total hip arthroplasty. The patient states that she is doing well at this time. The pain is under excellent control at this time and describes their pain as mild. They are currently on no medication for their pain. The patient is currently doing home exercise program. The patient feels that they are progressing well at this time. Note for "Post operative hip": She was getting some thigh pain and a "clunking" when she took long strides about a year ago. She said she no longer has the symptoms, but wanted to have her hip checked, since it had been three years since her last xray. She is not really having any pain, but she recently had a cobalt and chromium level and the level was concerning. She states that about one year ago, she had a short-lived grinding sensation in her right hip. She also had intermittent thigh pain for a couple of weeks and then that became better. She is very pleased with how the hip has functioned, but is very concerned now because of the elevated cobalt and chromium levels. 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 Three years ago, she has normal cobalt and chromium levels, but now the chromium is at 5.6 with a normal high of 2.1 and cobalt is 3.5 with a normal high of 0.9. This is very concerning. If anything, at 5-6 years postop, the levels should start decreasing. She is asymptomatic, which is encouraging. She is very concerned about the  metal and is questioning about revising this to a different construct. I feel that with the levels increasing as much as they are, that she is in significant danger of having an adverse tissue reaction. She also could potentially have systemic toxicity given these levels. I recommend we go ahead and change this to a ceramic on polyethylene bearing. It may require taking out the femoral component, but since it is an S-ROM, we can remove it without taking the sleeve out. We did discuss these issues in detail, procedure risks, potential complications, rehab course and she wants to proceed. She elects to proceed with surgery.   Allergies Minocycline HCl *TETRACYCLINES*  Problem List/Past Medical Status post right hip replacement (V43.64  Z96.60) Lumbar pain (724.2) Osteoarthritis Tinnitus   Family History Osteoarthritis. First Degree Relatives. mother and father Cerebrovascular Accident. mother Hypertension. mother Cancer. father Osteoporosis. mother and father Severe allergy. father    Social History Current work status. working full time Children. 0 Alcohol use. current drinker; drinks beer and wine; 5-7 per week Illicit drug use. no Living situation. live with spouse Tobacco / smoke exposure. no Pain Contract. no Tobacco use. Never smoker. never smoker Exercise. Exercises daily; does running / walking, individual sport and other Drug/Alcohol Rehab (Previously). no Drug/Alcohol Rehab (Currently). no Marital status. married Number of flights of stairs before winded. 2-3    Medication History Tretinoin skin cream Active. Zolpidem Tartrate (10MG  Tablet, Oral as needed) Active. Ibuprofen (200MG  Capsule, 1 (one)  Oral) Active. Voltaren (1% Gel, Transdermal) Active. Cyclobenzaprine HCl (10MG  Tablet, 1/2 tab Oral as needed) Active.   Past Surgical History Arthroscopy of Knee. left Hysterectomy. complete (non-cancerous) Total Hip Replacement.  right   Review of Systems General:Not Present- Chills, Fever, Night Sweats, Fatigue, Weight Gain, Weight Loss and Memory Loss. Skin:Not Present- Hives, Itching, Rash, Eczema and Lesions. HEENT:Not Present- Tinnitus, Headache, Double Vision, Visual Loss, Hearing Loss and Dentures. Respiratory:Not Present- Shortness of breath with exertion, Shortness of breath at rest, Allergies, Coughing up blood and Chronic Cough. Cardiovascular:Not Present- Chest Pain, Racing/skipping heartbeats, Difficulty Breathing Lying Down, Murmur, Swelling and Palpitations. Gastrointestinal:Not Present- Bloody Stool, Heartburn, Abdominal Pain, Vomiting, Nausea, Constipation, Diarrhea, Difficulty Swallowing, Jaundice and Loss of appetitie. Female Genitourinary:Not Present- Blood in Urine, Urinary frequency, Weak urinary stream, Discharge, Flank Pain, Incontinence, Painful Urination, Urgency, Urinary Retention and Urinating at Night. Musculoskeletal:Not Present- Muscle Weakness, Muscle Pain, Joint Swelling, Joint Pain, Back Pain, Morning Stiffness and Spasms. Neurological:Not Present- Tremor, Dizziness, Blackout spells, Paralysis, Difficulty with balance and Weakness. Psychiatric:Not Present- Insomnia.    Vitals( Weight: 135 lb Height: 64 in Weight was reported by patient. Height was reported by patient. Body Surface Area: 1.66 m Body Mass Index: 23.17 kg/m Pulse: 76 (Regular) Resp.: 14 (Unlabored) BP: 128/74 (Sitting, Right Arm, Standard)     Physical Exam The physical exam findings are as follows:   General Mental Status - Alert, cooperative and good historian. General Appearance- pleasant. Not in acute distress. Orientation- Oriented X3. Build & Nutrition- Well nourished and Well developed.   Head and Neck Head- normocephalic, atraumatic . Neck Global Assessment- supple. no bruit auscultated on the right and no bruit auscultated on the left.   Eye Pupil-  Bilateral- Regular and Round. Motion- Bilateral- EOMI.   Chest and Lung Exam Auscultation: Breath sounds:- clear at anterior chest wall and - clear at posterior chest wall. Adventitious sounds:- No Adventitious sounds.   Cardiovascular Auscultation:Rhythm- Regular rate and rhythm. Heart Sounds- S1 WNL and S2 WNL. Murmurs & Other Heart Sounds:Auscultation of the heart reveals - No Murmurs.   Abdomen Palpation/Percussion:Tenderness- Abdomen is non-tender to palpation. Rigidity (guarding)- Abdomen is soft. Auscultation:Auscultation of the abdomen reveals - Bowel sounds normal.   Female Genitourinary Not done, not pertinent to present illness  Musculoskeletal She is alert and oriented in no apparent distress. Evaluation of her right hip shows flexion to 120 degrees, rotation in 30 degrees, rotation out 40 degrees, and abduction to 40 degrees without discomfort. She has no tenderness about her greater trochanter.  RADIOGRAPHS AP of pelvis and AP and lateral of the right hip show her prosthesis in excellent position with no periprosthetic abnormalities.   Assessment & Plan Status post right hip replacement  Failed Right Hip Bearing Surface  Note: Plan is for a Right Head and Liner Revision by Dr. Wynelle Link.  Plan is to go home.  The patient does not have any contraindications and will receive TXA (tranexamic acid) prior to surgery.  Signed electronically by Joelene Millin, III PA-C

## 2013-07-04 ENCOUNTER — Encounter (HOSPITAL_COMMUNITY): Payer: 59 | Admitting: Anesthesiology

## 2013-07-04 ENCOUNTER — Ambulatory Visit (HOSPITAL_COMMUNITY): Payer: 59 | Admitting: Anesthesiology

## 2013-07-04 ENCOUNTER — Encounter (HOSPITAL_COMMUNITY)
Admission: RE | Disposition: A | Discharge status: Home Health | Payer: Self-pay | Source: Ambulatory Visit | Attending: Orthopedic Surgery

## 2013-07-04 ENCOUNTER — Encounter (HOSPITAL_COMMUNITY): Payer: Self-pay | Admitting: *Deleted

## 2013-07-04 ENCOUNTER — Inpatient Hospital Stay (HOSPITAL_COMMUNITY)
Admission: RE | Admit: 2013-07-04 | Discharge: 2013-07-05 | DRG: 468 | Disposition: A | Discharge status: Home Health | Payer: 59 | Source: Ambulatory Visit | Attending: Orthopedic Surgery | Admitting: Orthopedic Surgery

## 2013-07-04 ENCOUNTER — Inpatient Hospital Stay (HOSPITAL_COMMUNITY): Payer: 59

## 2013-07-04 DIAGNOSIS — Z8262 Family history of osteoporosis: Secondary | ICD-10-CM

## 2013-07-04 DIAGNOSIS — Z8261 Family history of arthritis: Secondary | ICD-10-CM

## 2013-07-04 DIAGNOSIS — Z8249 Family history of ischemic heart disease and other diseases of the circulatory system: Secondary | ICD-10-CM

## 2013-07-04 DIAGNOSIS — T84018A Broken internal joint prosthesis, other site, initial encounter: Secondary | ICD-10-CM

## 2013-07-04 DIAGNOSIS — Z79899 Other long term (current) drug therapy: Secondary | ICD-10-CM

## 2013-07-04 DIAGNOSIS — T84099A Other mechanical complication of unspecified internal joint prosthesis, initial encounter: Principal | ICD-10-CM | POA: Diagnosis present

## 2013-07-04 DIAGNOSIS — Y831 Surgical operation with implant of artificial internal device as the cause of abnormal reaction of the patient, or of later complication, without mention of misadventure at the time of the procedure: Secondary | ICD-10-CM | POA: Diagnosis present

## 2013-07-04 DIAGNOSIS — M810 Age-related osteoporosis without current pathological fracture: Secondary | ICD-10-CM | POA: Diagnosis present

## 2013-07-04 DIAGNOSIS — Z823 Family history of stroke: Secondary | ICD-10-CM

## 2013-07-04 DIAGNOSIS — Z96649 Presence of unspecified artificial hip joint: Secondary | ICD-10-CM

## 2013-07-04 DIAGNOSIS — Z85828 Personal history of other malignant neoplasm of skin: Secondary | ICD-10-CM

## 2013-07-04 DIAGNOSIS — Z7901 Long term (current) use of anticoagulants: Secondary | ICD-10-CM

## 2013-07-04 DIAGNOSIS — Z881 Allergy status to other antibiotic agents status: Secondary | ICD-10-CM

## 2013-07-04 HISTORY — PX: TOTAL HIP REVISION: SHX763

## 2013-07-04 LAB — CBC
HCT: 40.8 % (ref 36.0–46.0)
Hemoglobin: 13.6 g/dL (ref 12.0–15.0)
MCH: 31.9 pg (ref 26.0–34.0)
MCHC: 33.3 g/dL (ref 30.0–36.0)
MCV: 95.8 fL (ref 78.0–100.0)
Platelets: 283 10*3/uL (ref 150–400)
RBC: 4.26 MIL/uL (ref 3.87–5.11)
RDW: 12.4 % (ref 11.5–15.5)
WBC: 3.8 10*3/uL — ABNORMAL LOW (ref 4.0–10.5)

## 2013-07-04 LAB — TYPE AND SCREEN
ABO/RH(D): A POS
ANTIBODY SCREEN: NEGATIVE

## 2013-07-04 SURGERY — TOTAL HIP REVISION
Anesthesia: Spinal | Site: Hip | Laterality: Right

## 2013-07-04 MED ORDER — DEXTROSE-NACL 5-0.45 % IV SOLN
INTRAVENOUS | Status: DC
  Administered 2013-07-04 – 2013-07-05 (×2): via INTRAVENOUS

## 2013-07-04 MED ORDER — PROPOFOL INFUSION 10 MG/ML OPTIME
INTRAVENOUS | Status: DC | PRN
  Administered 2013-07-04: 75 ug/kg/min via INTRAVENOUS

## 2013-07-04 MED ORDER — CEFAZOLIN SODIUM-DEXTROSE 2-3 GM-% IV SOLR
INTRAVENOUS | Status: AC
Start: 2013-07-04 — End: 2013-07-04
  Filled 2013-07-04: qty 50

## 2013-07-04 MED ORDER — ZOLPIDEM TARTRATE 5 MG PO TABS
5.0000 mg | ORAL_TABLET | Freq: Every day | ORAL | Status: DC
  Administered 2013-07-05: 5 mg via ORAL
  Filled 2013-07-04: qty 1

## 2013-07-04 MED ORDER — KETAMINE HCL 10 MG/ML IJ SOLN
INTRAMUSCULAR | Status: DC | PRN
  Administered 2013-07-04: 20 mg via INTRAVENOUS
  Administered 2013-07-04: 10 mg via INTRAVENOUS

## 2013-07-04 MED ORDER — PHENOL 1.4 % MT LIQD
1.0000 | OROMUCOSAL | Status: DC | PRN
  Filled 2013-07-04: qty 177

## 2013-07-04 MED ORDER — DOCUSATE SODIUM 100 MG PO CAPS
100.0000 mg | ORAL_CAPSULE | Freq: Two times a day (BID) | ORAL | Status: DC
  Administered 2013-07-04 – 2013-07-05 (×2): 100 mg via ORAL
  Filled 2013-07-04 (×3): qty 1

## 2013-07-04 MED ORDER — ACETAMINOPHEN 650 MG RE SUPP: 650.0000 mg | Freq: Four times a day (QID) | RECTAL | Status: DC | PRN

## 2013-07-04 MED ORDER — DIPHENHYDRAMINE HCL 12.5 MG/5ML PO ELIX: 12.5000 mg | ORAL_SOLUTION | ORAL | Status: DC | PRN

## 2013-07-04 MED ORDER — BISACODYL 10 MG RE SUPP: 10.0000 mg | Freq: Every day | RECTAL | Status: DC | PRN

## 2013-07-04 MED ORDER — ACETAMINOPHEN 325 MG PO TABS: 650.0000 mg | ORAL_TABLET | Freq: Four times a day (QID) | ORAL | Status: DC | PRN

## 2013-07-04 MED ORDER — BUPIVACAINE LIPOSOME 1.3 % IJ SUSP
20.0000 mL | Freq: Once | INTRAMUSCULAR | Status: DC
  Filled 2013-07-04: qty 20

## 2013-07-04 MED ORDER — SODIUM CHLORIDE 0.9 % IJ SOLN
INTRAMUSCULAR | Status: DC | PRN
  Administered 2013-07-04: 30 mL

## 2013-07-04 MED ORDER — FLEET ENEMA 7-19 GM/118ML RE ENEM: 1.0000 | ENEMA | Freq: Once | RECTAL | Status: AC | PRN

## 2013-07-04 MED ORDER — TRIAMCINOLONE ACETONIDE 55 MCG/ACT NA AERO: 2.0000 | INHALATION_SPRAY | Freq: Every day | NASAL | Status: DC

## 2013-07-04 MED ORDER — DEXAMETHASONE SODIUM PHOSPHATE 10 MG/ML IJ SOLN
10.0000 mg | Freq: Every day | INTRAMUSCULAR | Status: DC
  Filled 2013-07-04: qty 1

## 2013-07-04 MED ORDER — PROPOFOL 10 MG/ML IV BOLUS
INTRAVENOUS | Status: AC
  Filled 2013-07-04: qty 20

## 2013-07-04 MED ORDER — TRAMADOL HCL 50 MG PO TABS: 50.0000 mg | ORAL_TABLET | Freq: Four times a day (QID) | ORAL | Status: DC | PRN

## 2013-07-04 MED ORDER — SODIUM CHLORIDE 0.9 % IJ SOLN
INTRAMUSCULAR | Status: AC
  Filled 2013-07-04: qty 10

## 2013-07-04 MED ORDER — DEXAMETHASONE SODIUM PHOSPHATE 10 MG/ML IJ SOLN
10.0000 mg | Freq: Once | INTRAMUSCULAR | Status: AC
  Administered 2013-07-04: 10 mg via INTRAVENOUS

## 2013-07-04 MED ORDER — ONDANSETRON HCL 4 MG/2ML IJ SOLN: 4.0000 mg | Freq: Four times a day (QID) | INTRAMUSCULAR | Status: DC | PRN

## 2013-07-04 MED ORDER — BUPIVACAINE HCL (PF) 0.25 % IJ SOLN
INTRAMUSCULAR | Status: AC
  Filled 2013-07-04: qty 30

## 2013-07-04 MED ORDER — TRANEXAMIC ACID 100 MG/ML IV SOLN
1000.0000 mg | INTRAVENOUS | Status: AC
  Administered 2013-07-04: 1000 mg via INTRAVENOUS
  Filled 2013-07-04: qty 10

## 2013-07-04 MED ORDER — MORPHINE SULFATE 10 MG/ML IJ SOLN
1.0000 mg | INTRAMUSCULAR | Status: DC | PRN
  Administered 2013-07-04 (×5): 2 mg via INTRAVENOUS

## 2013-07-04 MED ORDER — MIDAZOLAM HCL 5 MG/5ML IJ SOLN
INTRAMUSCULAR | Status: DC | PRN
  Administered 2013-07-04 (×2): 1 mg via INTRAVENOUS
  Administered 2013-07-04: 2 mg via INTRAVENOUS

## 2013-07-04 MED ORDER — SODIUM CHLORIDE 0.9 % IJ SOLN
INTRAMUSCULAR | Status: AC
  Filled 2013-07-04: qty 50

## 2013-07-04 MED ORDER — EPHEDRINE SULFATE 50 MG/ML IJ SOLN
INTRAMUSCULAR | Status: AC
  Filled 2013-07-04: qty 1

## 2013-07-04 MED ORDER — FENTANYL CITRATE 0.05 MG/ML IJ SOLN
INTRAMUSCULAR | Status: AC
  Filled 2013-07-04: qty 2

## 2013-07-04 MED ORDER — BUPIVACAINE LIPOSOME 1.3 % IJ SUSP
INTRAMUSCULAR | Status: DC | PRN
  Administered 2013-07-04: 20 mL

## 2013-07-04 MED ORDER — PHENYLEPHRINE HCL 10 MG/ML IJ SOLN
INTRAMUSCULAR | Status: DC | PRN
  Administered 2013-07-04 (×5): 80 ug via INTRAVENOUS

## 2013-07-04 MED ORDER — CHLORHEXIDINE GLUCONATE 4 % EX LIQD: 60.0000 mL | Freq: Once | CUTANEOUS | Status: DC

## 2013-07-04 MED ORDER — PROMETHAZINE HCL 25 MG/ML IJ SOLN: 6.2500 mg | INTRAMUSCULAR | Status: DC | PRN

## 2013-07-04 MED ORDER — POLYETHYLENE GLYCOL 3350 17 G PO PACK
17.0000 g | PACK | Freq: Every day | ORAL | Status: DC | PRN
  Administered 2013-07-04: 17 g via ORAL
  Filled 2013-07-04: qty 1

## 2013-07-04 MED ORDER — EPHEDRINE SULFATE 50 MG/ML IJ SOLN
INTRAMUSCULAR | Status: DC | PRN
  Administered 2013-07-04 (×3): 10 mg via INTRAVENOUS

## 2013-07-04 MED ORDER — LACTATED RINGERS IV SOLN
INTRAVENOUS | Status: DC
  Administered 2013-07-04: 13:00:00 via INTRAVENOUS
  Administered 2013-07-04: 1000 mL via INTRAVENOUS
  Administered 2013-07-04: 12:00:00 via INTRAVENOUS

## 2013-07-04 MED ORDER — ACETAMINOPHEN 10 MG/ML IV SOLN
1000.0000 mg | Freq: Once | INTRAVENOUS | Status: AC
  Administered 2013-07-04: 1000 mg via INTRAVENOUS
  Filled 2013-07-04: qty 100

## 2013-07-04 MED ORDER — KETAMINE HCL 10 MG/ML IJ SOLN
INTRAMUSCULAR | Status: AC
  Filled 2013-07-04: qty 1

## 2013-07-04 MED ORDER — DEXAMETHASONE SODIUM PHOSPHATE 10 MG/ML IJ SOLN
INTRAMUSCULAR | Status: AC
  Filled 2013-07-04: qty 1

## 2013-07-04 MED ORDER — LORATADINE 10 MG PO TABS
10.0000 mg | ORAL_TABLET | Freq: Every day | ORAL | Status: DC
  Filled 2013-07-04: qty 1

## 2013-07-04 MED ORDER — SODIUM CHLORIDE 0.9 % IV SOLN: INTRAVENOUS | Status: DC

## 2013-07-04 MED ORDER — RIVAROXABAN 10 MG PO TABS
10.0000 mg | ORAL_TABLET | Freq: Every day | ORAL | Status: DC
  Administered 2013-07-05: 10 mg via ORAL
  Filled 2013-07-04 (×2): qty 1

## 2013-07-04 MED ORDER — MIDAZOLAM HCL 2 MG/2ML IJ SOLN
INTRAMUSCULAR | Status: AC
  Filled 2013-07-04: qty 2

## 2013-07-04 MED ORDER — KETOROLAC TROMETHAMINE 15 MG/ML IJ SOLN: 7.5000 mg | Freq: Four times a day (QID) | INTRAMUSCULAR | Status: AC | PRN

## 2013-07-04 MED ORDER — DEXAMETHASONE 4 MG PO TABS
10.0000 mg | ORAL_TABLET | Freq: Every day | ORAL | Status: DC
  Filled 2013-07-04: qty 1

## 2013-07-04 MED ORDER — PHENYLEPHRINE 40 MCG/ML (10ML) SYRINGE FOR IV PUSH (FOR BLOOD PRESSURE SUPPORT)
PREFILLED_SYRINGE | INTRAVENOUS | Status: AC
  Filled 2013-07-04: qty 10

## 2013-07-04 MED ORDER — MORPHINE SULFATE 10 MG/ML IJ SOLN
INTRAMUSCULAR | Status: AC
  Filled 2013-07-04: qty 1

## 2013-07-04 MED ORDER — MORPHINE SULFATE 2 MG/ML IJ SOLN
1.0000 mg | INTRAMUSCULAR | Status: DC | PRN
Start: 2013-07-04 — End: 2013-07-05
  Administered 2013-07-04: 2 mg via INTRAVENOUS
  Filled 2013-07-04: qty 1

## 2013-07-04 MED ORDER — METOCLOPRAMIDE HCL 5 MG/ML IJ SOLN: 5.0000 mg | Freq: Three times a day (TID) | INTRAMUSCULAR | Status: DC | PRN

## 2013-07-04 MED ORDER — OXYCODONE HCL 5 MG PO TABS
5.0000 mg | ORAL_TABLET | ORAL | Status: DC | PRN
  Administered 2013-07-04 – 2013-07-05 (×5): 10 mg via ORAL
  Filled 2013-07-04 (×5): qty 2

## 2013-07-04 MED ORDER — FLUTICASONE PROPIONATE 50 MCG/ACT NA SUSP
2.0000 | Freq: Every day | NASAL | Status: DC
  Filled 2013-07-04: qty 16

## 2013-07-04 MED ORDER — ACETAMINOPHEN 500 MG PO TABS
1000.0000 mg | ORAL_TABLET | Freq: Four times a day (QID) | ORAL | Status: AC
  Administered 2013-07-04 – 2013-07-05 (×4): 1000 mg via ORAL
  Filled 2013-07-04 (×5): qty 2

## 2013-07-04 MED ORDER — LIDOCAINE HCL (PF) 2 % IJ SOLN
INTRAMUSCULAR | Status: DC | PRN
  Administered 2013-07-04: 100 mg via INTRADERMAL

## 2013-07-04 MED ORDER — ONDANSETRON HCL 4 MG PO TABS: 4.0000 mg | ORAL_TABLET | Freq: Four times a day (QID) | ORAL | Status: DC | PRN

## 2013-07-04 MED ORDER — METOCLOPRAMIDE HCL 10 MG PO TABS: 5.0000 mg | ORAL_TABLET | Freq: Three times a day (TID) | ORAL | Status: DC | PRN

## 2013-07-04 MED ORDER — BUPIVACAINE IN DEXTROSE 0.75-8.25 % IT SOLN
INTRATHECAL | Status: DC | PRN
  Administered 2013-07-04: 2 mL via INTRATHECAL

## 2013-07-04 MED ORDER — METHOCARBAMOL 500 MG PO TABS
500.0000 mg | ORAL_TABLET | Freq: Four times a day (QID) | ORAL | Status: DC | PRN
  Administered 2013-07-04 – 2013-07-05 (×2): 500 mg via ORAL
  Filled 2013-07-04 (×2): qty 1

## 2013-07-04 MED ORDER — ONDANSETRON HCL 4 MG/2ML IJ SOLN
INTRAMUSCULAR | Status: AC
  Filled 2013-07-04: qty 2

## 2013-07-04 MED ORDER — MENTHOL 3 MG MT LOZG
1.0000 | LOZENGE | OROMUCOSAL | Status: DC | PRN
  Filled 2013-07-04: qty 9

## 2013-07-04 MED ORDER — ONDANSETRON HCL 4 MG/2ML IJ SOLN
INTRAMUSCULAR | Status: DC | PRN
  Administered 2013-07-04: 4 mg via INTRAVENOUS

## 2013-07-04 MED ORDER — METHOCARBAMOL 1000 MG/10ML IJ SOLN
500.0000 mg | Freq: Four times a day (QID) | INTRAVENOUS | Status: DC | PRN
  Administered 2013-07-04: 500 mg via INTRAVENOUS
  Filled 2013-07-04: qty 5

## 2013-07-04 MED ORDER — CEFAZOLIN SODIUM-DEXTROSE 2-3 GM-% IV SOLR
2.0000 g | Freq: Four times a day (QID) | INTRAVENOUS | Status: AC
  Administered 2013-07-04 (×2): 2 g via INTRAVENOUS
  Filled 2013-07-04 (×2): qty 50

## 2013-07-04 MED ORDER — CEFAZOLIN SODIUM-DEXTROSE 2-3 GM-% IV SOLR
2.0000 g | INTRAVENOUS | Status: AC
  Administered 2013-07-04: 2 g via INTRAVENOUS

## 2013-07-04 MED ORDER — BUPIVACAINE HCL (PF) 0.25 % IJ SOLN
INTRAMUSCULAR | Status: DC | PRN
  Administered 2013-07-04: 30 mL

## 2013-07-04 MED ORDER — LIDOCAINE HCL (CARDIAC) 20 MG/ML IV SOLN
INTRAVENOUS | Status: AC
  Filled 2013-07-04: qty 5

## 2013-07-04 SURGICAL SUPPLY — 62 items
BAG SPEC THK2 15X12 ZIP CLS (MISCELLANEOUS) ×3
BAG ZIPLOCK 12X15 (MISCELLANEOUS) ×6 IMPLANT
BIT DRILL 2.8X128 (BIT) ×2 IMPLANT
BLADE EXTENDED COATED 6.5IN (ELECTRODE) ×2 IMPLANT
BLADE SAW SAG 73X25 THK (BLADE) ×1
BLADE SAW SGTL 73X25 THK (BLADE) ×1 IMPLANT
DRAPE INCISE IOBAN 66X45 STRL (DRAPES) ×2 IMPLANT
DRAPE ORTHO SPLIT 77X108 STRL (DRAPES) ×4
DRAPE POUCH INSTRU U-SHP 10X18 (DRAPES) ×2 IMPLANT
DRAPE SURG ORHT 6 SPLT 77X108 (DRAPES) ×2 IMPLANT
DRAPE U-SHAPE 47X51 STRL (DRAPES) ×2 IMPLANT
DRSG ADAPTIC 3X8 NADH LF (GAUZE/BANDAGES/DRESSINGS) ×2 IMPLANT
DRSG EMULSION OIL 3X16 NADH (GAUZE/BANDAGES/DRESSINGS) IMPLANT
DRSG MEPILEX BORDER 4X4 (GAUZE/BANDAGES/DRESSINGS) ×3 IMPLANT
DRSG MEPILEX BORDER 4X8 (GAUZE/BANDAGES/DRESSINGS) ×2 IMPLANT
DURAPREP 26ML APPLICATOR (WOUND CARE) ×2 IMPLANT
ELECT REM PT RETURN 9FT ADLT (ELECTROSURGICAL) ×2
ELECTRODE REM PT RTRN 9FT ADLT (ELECTROSURGICAL) ×1 IMPLANT
EVACUATOR 1/8 PVC DRAIN (DRAIN) ×2 IMPLANT
FACESHIELD WRAPAROUND (MASK) ×8 IMPLANT
FACESHIELD WRAPAROUND OR TEAM (MASK) ×4 IMPLANT
GAUZE SPONGE 4X4 12PLY STRL (GAUZE/BANDAGES/DRESSINGS) ×1 IMPLANT
GLOVE BIO SURGEON STRL SZ7.5 (GLOVE) ×2 IMPLANT
GLOVE BIO SURGEON STRL SZ8 (GLOVE) ×2 IMPLANT
GLOVE BIOGEL PI IND STRL 8 (GLOVE) ×3 IMPLANT
GLOVE BIOGEL PI INDICATOR 8 (GLOVE) ×3
GLOVE SURG SS PI 6.5 STRL IVOR (GLOVE) ×4 IMPLANT
GOWN STRL REUS W/TWL LRG LVL3 (GOWN DISPOSABLE) ×4 IMPLANT
GOWN STRL REUS W/TWL XL LVL3 (GOWN DISPOSABLE) ×2 IMPLANT
HEAD CER SROM 32MM PLUS (Hips) ×2 IMPLANT
IMMOBILIZER KNEE 20 (SOFTGOODS)
IMMOBILIZER KNEE 20 THIGH 36 (SOFTGOODS) IMPLANT
KIT BASIN OR (CUSTOM PROCEDURE TRAY) ×2 IMPLANT
LINER MARATHON 32 50 (Hips) ×1 IMPLANT
MANIFOLD NEPTUNE II (INSTRUMENTS) ×2 IMPLANT
NDL SAFETY ECLIPSE 18X1.5 (NEEDLE) IMPLANT
NEEDLE HYPO 18GX1.5 SHARP (NEEDLE)
NS IRRIG 1000ML POUR BTL (IV SOLUTION) ×2 IMPLANT
PACK TOTAL JOINT (CUSTOM PROCEDURE TRAY) ×2 IMPLANT
PADDING CAST COTTON 6X4 STRL (CAST SUPPLIES) ×3 IMPLANT
PASSER SUT SWANSON 36MM LOOP (INSTRUMENTS) ×2 IMPLANT
POSITIONER SURGICAL ARM (MISCELLANEOUS) ×2 IMPLANT
SPONGE GAUZE 4X4 12PLY (GAUZE/BANDAGES/DRESSINGS) ×1 IMPLANT
SPONGE LAP 18X18 X RAY DECT (DISPOSABLE) ×2 IMPLANT
STAPLER VISISTAT 35W (STAPLE) ×2 IMPLANT
STEM FEM MOD 36+8 13X18X160 (Hips) ×1 IMPLANT
STRIP CLOSURE SKIN 1/2X4 (GAUZE/BANDAGES/DRESSINGS) ×2 IMPLANT
SUCTION FRAZIER TIP 10 FR DISP (SUCTIONS) ×2 IMPLANT
SUT ETHIBOND NAB CT1 #1 30IN (SUTURE) ×4 IMPLANT
SUT MNCRL AB 4-0 PS2 18 (SUTURE) ×1 IMPLANT
SUT VIC AB 1 CT1 27 (SUTURE) ×6
SUT VIC AB 1 CT1 27XBRD ANTBC (SUTURE) ×3 IMPLANT
SUT VIC AB 2-0 CT1 27 (SUTURE) ×6
SUT VIC AB 2-0 CT1 TAPERPNT 27 (SUTURE) ×3 IMPLANT
SUT VLOC 180 0 24IN GS25 (SUTURE) ×4 IMPLANT
SWAB COLLECTION DEVICE MRSA (MISCELLANEOUS) ×2 IMPLANT
SYRINGE 60CC LL (MISCELLANEOUS) IMPLANT
TOWEL OR 17X26 10 PK STRL BLUE (TOWEL DISPOSABLE) ×4 IMPLANT
TRAY FOLEY CATH 14FRSI W/METER (CATHETERS) ×2 IMPLANT
TUBE ANAEROBIC SPECIMEN COL (MISCELLANEOUS) IMPLANT
WATER STERILE IRR 1500ML POUR (IV SOLUTION) ×2 IMPLANT
YANKAUER SUCT BULB TIP 10FT TU (MISCELLANEOUS) ×1 IMPLANT

## 2013-07-04 NOTE — Anesthesia Procedure Notes (Signed)
Spinal  Patient location during procedure: OR Start time: 07/04/2013 11:43 AM End time: 07/04/2013 11:49 AM Staffing CRNA/Resident: Alfonso Patten J Performed by: resident/CRNA  Preanesthetic Checklist Completed: patient identified, surgical consent, pre-op evaluation, timeout performed, IV checked, risks and benefits discussed and monitors and equipment checked Spinal Block Patient position: sitting Prep: Betadine and site prepped and draped Patient monitoring: heart rate, continuous pulse ox and blood pressure Approach: midline Location: L2-3 Needle Needle type: Sprotte  Needle gauge: 25 G Needle length: 9 cm Additional Notes Lot #[id number]  Expires 2016-09     Single attempt - no paresthesia or blood noted - atraumatic attempt - patient tolerated well.

## 2013-07-04 NOTE — Brief Op Note (Signed)
07/04/2013  1:01 PM  PATIENT:  [name]  64 y.o. female  PRE-OPERATIVE DIAGNOSIS:  RIGHT HIP BEARING SURFACE FAILURE   POST-OPERATIVE DIAGNOSIS:  RIGHT HIP BEARING SURFACE FAILURE   PROCEDURE:  Procedure(s): RIGHT TOTAL HIP REVISION (Right)  SURGEON:  Surgeon(s) and Role:    * [name], MD - Primary  PHYSICIAN ASSISTANT:   ASSISTANTS: Ardeen Jourdain, PA-C   ANESTHESIA:   spinal  EBL:  Total I/O In: 2000 [I.V.:2000] Out: 350 [Urine:150; Blood:200]  BLOOD ADMINISTERED:none  DRAINS: (Medium) Hemovact drain(s) in the right hip with  Suction Open   LOCAL MEDICATIONS USED:  OTHER Exparel  COUNTS:  YES  TOURNIQUET:  * No tourniquets in log *  DICTATION: .Other Dictation: Dictation Number [id number]  PLAN OF CARE: Admit to inpatient   PATIENT DISPOSITION:  PACU - hemodynamically stable.

## 2013-07-04 NOTE — Anesthesia Preprocedure Evaluation (Signed)
Anesthesia Evaluation  Patient identified by MRN, date of birth, ID band Patient awake    Reviewed: Allergy & Precautions, H&P , NPO status , Patient's Chart, lab work & pertinent test results  Airway       Dental   Pulmonary neg pulmonary ROS,          Cardiovascular negative cardio ROS      Neuro/Psych negative neurological ROS  negative psych ROS   GI/Hepatic negative GI ROS, Neg liver ROS,   Endo/Other  negative endocrine ROS  Renal/GU negative Renal ROS     Musculoskeletal negative musculoskeletal ROS (+)   Abdominal   Peds  Hematology negative hematology ROS (+)   Anesthesia Other Findings   Reproductive/Obstetrics negative OB ROS                           Anesthesia Physical Anesthesia Plan  ASA: II  Anesthesia Plan:    Post-op Pain Management:    Induction:   Airway Management Planned:   Additional Equipment:   Intra-op Plan:   Post-operative Plan:   Informed Consent: I have reviewed the patients History and Physical, chart, labs and discussed the procedure including the risks, benefits and alternatives for the proposed anesthesia with the patient or authorized representative who has indicated his/her understanding and acceptance.   Dental advisory given  Plan Discussed with: CRNA  Anesthesia Plan Comments:         Anesthesia Quick Evaluation

## 2013-07-04 NOTE — Interval H&P Note (Signed)
History and Physical Interval Note:  07/04/2013 10:58 AM  [name]  has presented today for surgery, with the diagnosis of RIGHT HIP BEARING SURFACE FAILURE   The various methods of treatment have been discussed with the patient and family. After consideration of risks, benefits and other options for treatment, the patient has consented to  Procedure(s): [place] (Right) as a surgical intervention .  The patient's history has been reviewed, patient examined, no change in status, stable for surgery.  I have reviewed the patient's chart and labs.  Questions were answered to the patient's satisfaction.     [name]

## 2013-07-04 NOTE — Plan of Care (Signed)
Problem: Consults Goal: Diagnosis- Total Joint Replacement Revision rip hip

## 2013-07-04 NOTE — Anesthesia Postprocedure Evaluation (Signed)
Anesthesia Post Note  Patient: [name]  Procedure(s) Performed: Procedure(s) (LRB): RIGHT TOTAL HIP REVISION (Right)  Anesthesia type: Spinal  Patient location: PACU  Post pain: Pain level controlled  Post assessment: Post-op Vital signs reviewed  Last Vitals: BP 120/68  Pulse 70  Temp(Src) 36.8 C (Oral)  Resp 16  Ht 5\' 4"  (1.626 m)  Wt 134 lb (60.782 kg)  BMI 22.99 kg/m2  SpO2 100%  Post vital signs: Reviewed  Level of consciousness: sedated  Complications: No apparent anesthesia complications

## 2013-07-04 NOTE — Transfer of Care (Signed)
Immediate Anesthesia Transfer of Care Note  Patient: [name]  Procedure(s) Performed: Procedure(s): RIGHT TOTAL HIP REVISION (Right)  Patient Location: PACU  Anesthesia Type:Spinal  Level of Consciousness: awake, alert , oriented and patient cooperative  Airway & Oxygen Therapy: Patient Spontanous Breathing and Patient connected to face mask oxygen  Post-op Assessment: Report given to PACU RN, Post -op Vital signs reviewed and stable and SAB level L1.    Post vital signs: Reviewed and stable  Complications: No apparent anesthesia complications

## 2013-07-04 NOTE — Progress Notes (Signed)
CARE MANAGEMENT NOTE 07/04/2013  Patient:  [name], [name]   Account Number:  [id number]  Date Initiated:  07/04/2013  Documentation initiated by:  DAVIS,RHONDA  Subjective/Objective Assessment:   total hip revision     Action/Plan:   homewith hhc.will check and see if any dme is needed since this is a revision   Anticipated DC Date:  07/07/2013   Anticipated DC Plan:  [place] referral  NA      DC Planning Services  CM consult      [facility] Choice  NA   Choice offered to / List presented to:  C-1 Patient   DME arranged  NA      DME agency  NA     [place] arranged  HH-2 PT  HH-3 OT      [place]   Status of service:  In process, will continue to follow Medicare Important Message given?  NA - LOS <3 / Initial given by admissions (If response is "NO", the following Medicare IM given date fields will be blank) Date Medicare IM given:   Date Additional Medicare IM given:    Discharge Disposition:    Per UR Regulation:  Reviewed for med. necessity/level of care/duration of stay  If discussed at [place] of Stay Meetings, dates discussed:    Comments:  Suanne Marker Davis,RN,BSN,CCM

## 2013-07-04 NOTE — H&P (View-Only) (Signed)
[name] DOB: 1950/05/22 Married / Language: English / Race: White Female  Date of Admission:  07-04-2013  Chief Complaint:  Failed Right Hip Bearing Surface  History of Present Illness The patient is a 63 year old female who comes in for a preoperative History and Physical. The patient is scheduled for a right head and liner exchange to be performed by Dr. Dione Plover. Aluisio, MD at [facility] on 07-04-2013. The patient is a 63 year old female presenting for a post-operative visit. The patient comes in today 6 years out from right total hip arthroplasty. The patient states that she is doing well at this time. The pain is under excellent control at this time and describes their pain as mild. They are currently on no medication for their pain. The patient is currently doing home exercise program. The patient feels that they are progressing well at this time. Note for "Post operative hip": She was getting some thigh pain and a "clunking" when she took long strides about a year ago. She said she no longer has the symptoms, but wanted to have her hip checked, since it had been three years since her last xray. She is not really having any pain, but she recently had a cobalt and chromium level and the level was concerning. She states that about one year ago, she had a short-lived grinding sensation in her right hip. She also had intermittent thigh pain for a couple of weeks and then that became better. She is very pleased with how the hip has functioned, but is very concerned now because of the elevated cobalt and chromium levels. 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 Three years ago, she has normal cobalt and chromium levels, but now the chromium is at 5.6 with a normal high of 2.1 and cobalt is 3.5 with a normal high of 0.9. This is very concerning. If anything, at 5-6 years postop, the levels should start decreasing. She is asymptomatic, which is encouraging. She is very concerned about the  metal and is questioning about revising this to a different construct. I feel that with the levels increasing as much as they are, that she is in significant danger of having an adverse tissue reaction. She also could potentially have systemic toxicity given these levels. I recommend we go ahead and change this to a ceramic on polyethylene bearing. It may require taking out the femoral component, but since it is an S-ROM, we can remove it without taking the sleeve out. We did discuss these issues in detail, procedure risks, potential complications, rehab course and she wants to proceed. She elects to proceed with surgery.   Allergies Minocycline HCl *TETRACYCLINES*  Problem List/Past Medical Status post right hip replacement (V43.64  Z96.60) Lumbar pain (724.2) Osteoarthritis Tinnitus   Family History Osteoarthritis. First Degree Relatives. mother and father Cerebrovascular Accident. mother Hypertension. mother Cancer. father Osteoporosis. mother and father Severe allergy. father    Social History Current work status. working full time Children. 0 Alcohol use. current drinker; drinks beer and wine; 5-7 per week Illicit drug use. no Living situation. live with spouse Tobacco / smoke exposure. no Pain Contract. no Tobacco use. Never smoker. never smoker Exercise. Exercises daily; does running / walking, individual sport and other Drug/Alcohol Rehab (Previously). no Drug/Alcohol Rehab (Currently). no Marital status. married Number of flights of stairs before winded. 2-3    Medication History Tretinoin skin cream Active. Zolpidem Tartrate (10MG  Tablet, Oral as needed) Active. Ibuprofen (200MG  Capsule, 1 (one)  Oral) Active. Voltaren (1% Gel, Transdermal) Active. Cyclobenzaprine HCl (10MG  Tablet, 1/2 tab Oral as needed) Active.   Past Surgical History Arthroscopy of Knee. left Hysterectomy. complete (non-cancerous) Total Hip Replacement.  right   Review of Systems General:Not Present- Chills, Fever, Night Sweats, Fatigue, Weight Gain, Weight Loss and Memory Loss. Skin:Not Present- Hives, Itching, Rash, Eczema and Lesions. HEENT:Not Present- Tinnitus, Headache, Double Vision, Visual Loss, Hearing Loss and Dentures. Respiratory:Not Present- Shortness of breath with exertion, Shortness of breath at rest, Allergies, Coughing up blood and Chronic Cough. Cardiovascular:Not Present- Chest Pain, Racing/skipping heartbeats, Difficulty Breathing Lying Down, Murmur, Swelling and Palpitations. Gastrointestinal:Not Present- Bloody Stool, Heartburn, Abdominal Pain, Vomiting, Nausea, Constipation, Diarrhea, Difficulty Swallowing, Jaundice and Loss of appetitie. Female Genitourinary:Not Present- Blood in Urine, Urinary frequency, Weak urinary stream, Discharge, Flank Pain, Incontinence, Painful Urination, Urgency, Urinary Retention and Urinating at Night. Musculoskeletal:Not Present- Muscle Weakness, Muscle Pain, Joint Swelling, Joint Pain, Back Pain, Morning Stiffness and Spasms. Neurological:Not Present- Tremor, Dizziness, Blackout spells, Paralysis, Difficulty with balance and Weakness. Psychiatric:Not Present- Insomnia.    Vitals( Weight: 135 lb Height: 64 in Weight was reported by patient. Height was reported by patient. Body Surface Area: 1.66 m Body Mass Index: 23.17 kg/m Pulse: 76 (Regular) Resp.: 14 (Unlabored) BP: 128/74 (Sitting, Right Arm, Standard)     Physical Exam The physical exam findings are as follows:   General Mental Status - Alert, cooperative and good historian. General Appearance- pleasant. Not in acute distress. Orientation- Oriented X3. Build & Nutrition- Well nourished and Well developed.   Head and Neck Head- normocephalic, atraumatic . Neck Global Assessment- supple. no bruit auscultated on the right and no bruit auscultated on the left.   Eye Pupil-  Bilateral- Regular and Round. Motion- Bilateral- EOMI.   Chest and Lung Exam Auscultation: Breath sounds:- clear at anterior chest wall and - clear at posterior chest wall. Adventitious sounds:- No Adventitious sounds.   Cardiovascular Auscultation:Rhythm- Regular rate and rhythm. Heart Sounds- S1 WNL and S2 WNL. Murmurs & Other Heart Sounds:Auscultation of the heart reveals - No Murmurs.   Abdomen Palpation/Percussion:Tenderness- Abdomen is non-tender to palpation. Rigidity (guarding)- Abdomen is soft. Auscultation:Auscultation of the abdomen reveals - Bowel sounds normal.   Female Genitourinary Not done, not pertinent to present illness  Musculoskeletal She is alert and oriented in no apparent distress. Evaluation of her right hip shows flexion to 120 degrees, rotation in 30 degrees, rotation out 40 degrees, and abduction to 40 degrees without discomfort. She has no tenderness about her greater trochanter.  RADIOGRAPHS AP of pelvis and AP and lateral of the right hip show her prosthesis in excellent position with no periprosthetic abnormalities.   Assessment & Plan Status post right hip replacement  Failed Right Hip Bearing Surface  Note: Plan is for a Right Head and Liner Revision by Dr. Wynelle Link.  Plan is to go home.  The patient does not have any contraindications and will receive TXA (tranexamic acid) prior to surgery.  Signed electronically by Joelene Millin, III PA-C

## 2013-07-05 LAB — BASIC METABOLIC PANEL
BUN: 8 mg/dL (ref 6–23)
CALCIUM: 9 mg/dL (ref 8.4–10.5)
CO2: 29 mEq/L (ref 19–32)
Chloride: 102 mEq/L (ref 96–112)
Creatinine, Ser: 0.62 mg/dL (ref 0.50–1.10)
GFR calc Af Amer: >90 mL/min (ref >90)
GFR calc non Af Amer: >90 mL/min (ref >90)
Glucose, Bld: 149 mg/dL — ABNORMAL HIGH (ref 70–99)
Potassium: 4.4 mEq/L (ref 3.7–5.3)
SODIUM: 139 meq/L (ref 137–147)

## 2013-07-05 LAB — CBC
HCT: 38.6 % (ref 36.0–46.0)
Hemoglobin: 12.5 g/dL (ref 12.0–15.0)
MCH: 31.3 pg (ref 26.0–34.0)
MCHC: 32.4 g/dL (ref 30.0–36.0)
MCV: 96.7 fL (ref 78.0–100.0)
PLATELETS: 265 10*3/uL (ref 150–400)
RBC: 3.99 MIL/uL (ref 3.87–5.11)
RDW: 12.2 % (ref 11.5–15.5)
WBC: 13.8 10*3/uL — ABNORMAL HIGH (ref 4.0–10.5)

## 2013-07-05 MED ORDER — METHOCARBAMOL 500 MG PO TABS: 500.0000 mg | ORAL_TABLET | Freq: Four times a day (QID) | ORAL | Status: DC | PRN

## 2013-07-05 MED ORDER — OXYCODONE HCL 5 MG PO TABS: 5.0000 mg | ORAL_TABLET | ORAL | Status: DC | PRN

## 2013-07-05 MED ORDER — TRAMADOL HCL 50 MG PO TABS: 50.0000 mg | ORAL_TABLET | Freq: Four times a day (QID) | ORAL | Status: DC | PRN

## 2013-07-05 MED ORDER — RIVAROXABAN 10 MG PO TABS: 10.0000 mg | ORAL_TABLET | Freq: Every day | ORAL | Status: DC

## 2013-07-05 NOTE — Discharge Summary (Signed)
Physician Discharge Summary   Patient ID: [name] MRN: [id number] DOB/AGE: 1950-09-21 63 y.o.  Admit date: 07/04/2013 Discharge date: 07-05-2013  Primary Diagnosis:  Right hip bearing surface failure.  Admission Diagnoses:  Past Medical History  Diagnosis Date  . Hx of seasonal allergies   . H/O tinnitus     bilateral mild  . Arthritis     osteoarthritis, osteoporosis. Past hx. of skin staph infections-no problems at present  . 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 H/O oophorectomy     '99   Discharge Diagnoses:   Principal Problem:   Failed total hip arthroplasty  Estimated body mass index is 22.99 kg/(m^2) as calculated from the following:   Height as of this encounter: 5' 4"  (1.626 m).   Weight as of this encounter: 60.782 kg (134 lb).  Procedure(s) (LRB): RIGHT TOTAL HIP REVISION (Right)   Consults: None  HPI: [name] is a 63 year old female, who had a right  total hip arthroplasty done with metal-on-metal construct several years  ago. She has had some discomfort in the hip and we have obtained cobalt  chromium levels, which showed a significant increase in the past year.  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  Laboratory Data: Admission on 07/04/2013  Component Date Value Ref Range Status  . WBC 07/04/2013 3.8* 4.0 - 10.5 K/uL Final  . RBC 07/04/2013 4.26  3.87 - 5.11 MIL/uL Final  . Hemoglobin 07/04/2013 13.6  12.0 - 15.0 g/dL Final  . HCT 07/04/2013 40.8  36.0 - 46.0 % Final  . MCV 07/04/2013 95.8  78.0 - 100.0 fL Final  . MCH 07/04/2013 31.9  26.0 - 34.0 pg Final  . MCHC 07/04/2013 33.3  30.0 - 36.0 g/dL Final  . RDW 07/04/2013 12.4  11.5 - 15.5 % Final  . Platelets 07/04/2013 283  150 - 400 K/uL Final  . ABO/RH(D) 07/04/2013 A POS   Final  . Antibody  Screen 07/04/2013 NEG   Final  . Sample Expiration 07/04/2013 07/07/2013   Final  . WBC 07/05/2013 13.8* 4.0 - 10.5 K/uL Final  . RBC 07/05/2013 3.99  3.87 - 5.11 MIL/uL Final  . Hemoglobin 07/05/2013 12.5  12.0 - 15.0 g/dL Final  . HCT 07/05/2013 38.6  36.0 - 46.0 % Final  . MCV 07/05/2013 96.7  78.0 - 100.0 fL Final  . MCH 07/05/2013 31.3  26.0 - 34.0 pg Final  . MCHC 07/05/2013 32.4  30.0 - 36.0 g/dL Final  . RDW 07/05/2013 12.2  11.5 - 15.5 % Final  . Platelets 07/05/2013 265  150 - 400 K/uL Final  . Sodium 07/05/2013 139  137 - 147 mEq/L Final  . Potassium 07/05/2013 4.4  3.7 - 5.3 mEq/L Final  . Chloride 07/05/2013 102  96 - 112 mEq/L Final  . CO2 07/05/2013 29  19 - 32 mEq/L Final  . Glucose, Bld 07/05/2013 149* 70 - 99 mg/dL Final  . BUN 07/05/2013 8  6 - 23 mg/dL Final  . Creatinine, Ser 07/05/2013 0.62  0.50 - 1.10 mg/dL Final  . Calcium 07/05/2013 9.0  8.4 - 10.5 mg/dL Final  . GFR calc non Af Amer 07/05/2013 >90  >90 mL/min Final  . GFR calc Af Amer 07/05/2013 >90  >90 mL/min Final   Comment: (NOTE)  The eGFR has been calculated using the CKD EPI equation.                          This calculation has not been validated in all clinical situations.                          eGFR's persistently <90 mL/min signify possible Chronic Kidney                          Disease.  Hospital Outpatient Visit on 06/29/2013  Component Date Value Ref Range Status  . MRSA, PCR 06/29/2013 NEGATIVE  NEGATIVE Final  . Staphylococcus aureus 06/29/2013 POSITIVE* NEGATIVE Final   Comment:                                 The Xpert SA Assay (FDA                          approved for NASAL specimens                          in patients over 50 years of age),                          is one component of                          a comprehensive surveillance                          program.  Test performance has                          been validated by [employer] for patients greater                          than or equal to 58 year old.                          It is not intended                          to diagnose infection nor to                          guide or monitor treatment.  Marland Kitchen aPTT 06/29/2013 30  24 - 37 seconds Final  . Sodium 06/29/2013 142  137 - 147 mEq/L Final  . Potassium 06/29/2013 4.5  3.7 - 5.3 mEq/L Final  . Chloride 06/29/2013 102  96 - 112 mEq/L Final  . CO2 06/29/2013 28  19 - 32 mEq/L Final  . Glucose, Bld 06/29/2013 94  70 - 99 mg/dL Final  . BUN 06/29/2013 13  6 - 23 mg/dL Final  . Creatinine, Ser 06/29/2013 0.75  0.50 - 1.10 mg/dL Final  . Calcium 06/29/2013 10.0  8.4 - 10.5  mg/dL Final  . Total Protein 06/29/2013 7.6  6.0 - 8.3 g/dL Final  . Albumin 06/29/2013 4.1  3.5 - 5.2 g/dL Final  . AST 06/29/2013 24  0 - 37 U/L Final  . ALT 06/29/2013 15  0 - 35 U/L Final  . Alkaline Phosphatase 06/29/2013 72  39 - 117 U/L Final  . Total Bilirubin 06/29/2013 0.7  0.3 - 1.2 mg/dL Final  . GFR calc non Af Amer 06/29/2013 >90  >90 mL/min Final  . GFR calc Af Amer 06/29/2013 >90  >90 mL/min Final   Comment: (NOTE)                          The eGFR has been calculated using the CKD EPI equation.                          This calculation has not been validated in all clinical situations.                          eGFR's persistently <90 mL/min signify possible Chronic Kidney                          Disease.  Marland Kitchen Prothrombin Time 06/29/2013 13.4  11.6 - 15.2 seconds Final  . INR 06/29/2013 1.04  0.00 - 1.49 Final  . Color, Urine 06/29/2013 YELLOW  YELLOW Final  . APPearance 06/29/2013 CLEAR  CLEAR Final  . Specific Gravity, Urine 06/29/2013 1.011  1.005 - 1.030 Final  . pH 06/29/2013 5.5  5.0 - 8.0 Final  . Glucose, UA 06/29/2013 NEGATIVE  NEGATIVE mg/dL Final  . Hgb urine dipstick 06/29/2013 TRACE* NEGATIVE Final  . Bilirubin Urine 06/29/2013 NEGATIVE  NEGATIVE Final  . Ketones, ur 06/29/2013 NEGATIVE   NEGATIVE mg/dL Final  . Protein, ur 06/29/2013 NEGATIVE  NEGATIVE mg/dL Final  . Urobilinogen, UA 06/29/2013 0.2  0.0 - 1.0 mg/dL Final  . Nitrite 06/29/2013 NEGATIVE  NEGATIVE Final  . Leukocytes, UA 06/29/2013 NEGATIVE  NEGATIVE Final  . Squamous Epithelial / LPF 06/29/2013 RARE  RARE Final  . RBC / HPF 06/29/2013 0-2  <3 RBC/hpf Final     X-Rays:Dg Hip Complete Right  06/29/2013   CLINICAL DATA:  right hip bearing surface failure  EXAM: RIGHT HIP - COMPLETE 2+ VIEW  COMPARISON:  Postop hip 06/16/2007  FINDINGS: Native osseous structures demonstrates no evidence of acute fracture. A right hip prostheses is appreciated without radiographic evidence of fracture. Positioning appears unchanged when compared to previous study. Soft tissues unremarkable.  IMPRESSION: No acute osseous or hardware abnormalities.   Electronically Signed   By: Margaree Mackintosh M.D.   On: 06/29/2013 14:28   Dg Pelvis Portable  07/04/2013   CLINICAL DATA:  postop  EXAM: PORTABLE PELVIS 1-2 VIEWS  COMPARISON:  Single-view pelvis 06/16/2007  FINDINGS: Patient is status post right hip arthroplasty. Postsurgical changes within the soft tissues of the right hip. Drainage catheter appreciated. Hardware appears intact. Native osseous structures are osteopenic. Areas of periarticular sclerosis and hypertrophic spurring left hip. Native osseous structures otherwise unremarkable.  IMPRESSION: Patient is status post total right hip arthroplasty.   Electronically Signed   By: Margaree Mackintosh M.D.   On: 07/04/2013 14:11    EKG:No orders found for this or any previous visit.   Hospital Course: Patient was admitted to [facility] and  taken to the OR and underwent the above state procedure without complications.  Patient tolerated the procedure well and was later transferred to the recovery room and then to the orthopaedic floor for postoperative care.  They were given PO and IV analgesics for pain control following their surgery.   They were given 24 hours of postoperative antibiotics of  Anti-infectives   Start     Dose/Rate Route Frequency Ordered Stop   07/04/13 1800  ceFAZolin (ANCEF) IVPB 2 g/50 mL premix     2 g 100 mL/hr over 30 Minutes Intravenous Every 6 hours 07/04/13 1502 07/05/13 0015   07/04/13 0838  ceFAZolin (ANCEF) IVPB 2 g/50 mL premix     2 g 100 mL/hr over 30 Minutes Intravenous On call to O.R. 07/04/13 9417 07/04/13 1150     and started on DVT prophylaxis in the form of Xarelto.   PT and OT were ordered for total hip protocol.  The patient was allowed to be WBAT with therapy. Discharge planning was consulted to help with postop disposition and equipment needs.  Patient had a good night on the evening of surgery.  They started to get up OOB with therapy on day one.  Hemovac drain was pulled without difficulty.  The knee immobilizer was removed and discontinued.  Patient was seen in rounds on day one by Dr. Wynelle Link and it was felt that as long as she did well with both sessions therapy that morning, then she could go home later that day.  Arrangements were being made for discharge and it was felt aht she would be ready to go home.   Diet: Regular diet Activity:WBAT No bending hip over 90 degrees- A "L" Angle Do not cross legs Do not let foot roll inward When turning these patients a pillow should be placed between the patient's legs to prevent crossing. Patients should have the affected knee fully extended when trying to sit or stand from all surfaces to prevent excessive hip flexion. When ambulating and turning toward the affected side the affected leg should have the toes turned out prior to moving the walker and the rest of patient's body as to prevent internal rotation/ turning in of the leg. Abduction pillows are the most effective way to prevent a patient from not crossing legs or turning toes in at rest. If an abduction pillow is not ordered placing a regular pillow length wise between the  patient's legs is also an effective reminder. It is imperative that these precautions be maintained so that the surgical hip does not dislocate. Follow-up:in 2 weeks Disposition - Home Discharged Condition: good   Discharge Instructions   Call MD / Call 911    Complete by:  As directed   If you experience chest pain or shortness of breath, CALL 911 and be transported to the hospital emergency room.  If you develope a fever above 101 F, pus (white drainage) or increased drainage or redness at the wound, or calf pain, call your surgeon's office.     Change dressing    Complete by:  As directed   You may change your dressing dressing daily with sterile 4 x 4 inch gauze dressing and paper tape.  Do not submerge the incision under water.     Constipation Prevention    Complete by:  As directed   Drink plenty of fluids.  Prune juice may be helpful.  You may use a stool softener, such as Colace (over the counter) 100 mg twice a  day.  Use MiraLax (over the counter) for constipation as needed.     Diet general    Complete by:  As directed      Discharge instructions    Complete by:  As directed   Pick up stool softner and laxative for home. Do not submerge incision under water. May shower. Continue to use ice for pain and swelling from surgery. Hip precautions.  Total Hip Protocol.  Take Xarelto for two and a half more weeks, then discontinue Xarelto. Once the patient has completed the blood thinner regimen, then take a Baby 81 mg Aspirin daily for three more weeks.     Do not sit on low chairs, stoools or toilet seats, as it may be difficult to get up from low surfaces    Complete by:  As directed      Driving restrictions    Complete by:  As directed   No driving until released by the physician.     Follow the hip precautions as taught in Physical Therapy    Complete by:  As directed      Increase activity slowly as tolerated    Complete by:  As directed      Lifting restrictions     Complete by:  As directed   No lifting until released by the physician.     Patient may shower    Complete by:  As directed   You may shower without a dressing once there is no drainage.  Do not wash over the wound.  If drainage remains, do not shower until drainage stops.     TED hose    Complete by:  As directed   Use stockings (TED hose) for 3 weeks on both leg(s).  You may remove them at night for sleeping.     Weight bearing as tolerated    Complete by:  As directed             Medication List    STOP taking these medications       cholecalciferol 1000 UNITS tablet  Commonly known as:  VITAMIN D     clindamycin 1 % gel  Commonly known as:  CLINDAGEL     Fish Oil 1000 MG Caps     HYDROcodone-acetaminophen 5-325 MG per tablet  Commonly known as:  NORCO/VICODIN     ibuprofen 200 MG tablet  Commonly known as:  ADVIL,MOTRIN     multivitamin with minerals Tabs tablet     oyster calcium 500 MG Tabs tablet     tretinoin 0.1 % cream  Commonly known as:  RETIN-A     vitamin E 400 UNIT capsule      TAKE these medications       cetirizine 10 MG tablet  Commonly known as:  ZYRTEC  Take 10 mg by mouth daily.     magnesium oxide 400 MG tablet  Commonly known as:  MAG-OX  Take 400 mg by mouth daily.     methocarbamol 500 MG tablet  Commonly known as:  ROBAXIN  Take 1 tablet (500 mg total) by mouth every 6 (six) hours as needed for muscle spasms.     NASACORT AQ 55 MCG/ACT Aero nasal inhaler  Generic drug:  triamcinolone  Place 2 sprays into the nose daily.     oxyCODONE 5 MG immediate release tablet  Commonly known as:  Oxy IR/ROXICODONE  Take 1-2 tablets (5-10 mg total) by mouth every 3 (three) hours as needed for moderate  pain, severe pain or breakthrough pain.     rivaroxaban 10 MG Tabs tablet  Commonly known as:  XARELTO  - Take 1 tablet (10 mg total) by mouth daily with breakfast. Take Xarelto for two and a half more weeks, then discontinue Xarelto.  -  Once the patient has completed the blood thinner regimen, then take a Baby 81 mg Aspirin daily for three more weeks.     traMADol 50 MG tablet  Commonly known as:  ULTRAM  Take 1-2 tablets (50-100 mg total) by mouth every 6 (six) hours as needed (mild to moderate pain).           Follow-up Information   Follow up with [name], MD. Schedule an appointment as soon as possible for a visit in 2 weeks.   Specialty:  Orthopedic Surgery   Contact information:   [address] [place] [address] [phone]       Signed: Arlee Muslim, PA-C Orthopaedic Surgery 07/05/2013, 8:05 AM  .

## 2013-07-05 NOTE — Evaluation (Signed)
Physical Therapy Evaluation Patient Details Name: [name] MRN: [id number] DOB: 16-Jun-1950 Today's Date: 07/05/2013   History of Present Illness  63 yo female s/p R THA revision-posterior approach 6/22.   Clinical Impression  On eval, pt required Min assist for mobility-able to ambulate ~115 feet with walker. Tolerated activity well. Pt plans to d/c home later today. Will plan to see for a 2nd session.     Follow Up Recommendations Home health PT    Equipment Recommendations  None recommended by PT (pt has borrowed RW)    Recommendations for Other Services OT consult     Precautions / Restrictions Precautions Precautions: Posterior Hip Precaution Comments: reviewed/demonstrated hip precautions Restrictions Weight Bearing Restrictions: No RLE Weight Bearing: Weight bearing as tolerated      Mobility  Bed Mobility Overal bed mobility: Needs Assistance Bed Mobility: Supine to Sit     Supine to sit: Min assist     General bed mobility comments: assist for R LE off bed. VCs safety, technique, hand placement  Transfers Overall transfer level: Needs assistance Equipment used: Rolling walker (2 wheeled) Transfers: Sit to/from Stand Sit to Stand: Min guard;From elevated surface         General transfer comment: close guard for safety. VCs safety, technique, hand/LE placement.   Ambulation/Gait Ambulation/Gait assistance: Min guard Ambulation Distance (Feet): 115 Feet   Gait Pattern/deviations: Step-to pattern;Step-through pattern;Decreased stride length;Decreased step length - right;Antalgic     General Gait Details: slow gait speed. VCs safety, technique, sequence.   Stairs            Wheelchair Mobility    Modified Rankin (Stroke Patients Only)       Balance                                             Pertinent Vitals/Pain 2/10 R hip. Ice applied end of session    [place] expects to be discharged  to:: Private residence Living Arrangements: Spouse/significant other   Type of Home: House Home Access: [place]: One [place]: Environmental consultant - 2 wheels;Cane - single point Additional Comments: borrowed walker    Prior Function Level of Independence: Independent               Hand Dominance        Extremity/Trunk Assessment               Lower Extremity Assessment: RLE deficits/detail RLE Deficits / Details: hip flex 2/5, hip abd/add 2/5, moves ankle well    Cervical / Trunk Assessment: Normal  Communication   Communication: No difficulties  Cognition Arousal/Alertness: Awake/alert Behavior During Therapy: WFL for tasks assessed/performed Overall Cognitive Status: Within Functional Limits for tasks assessed                      General Comments      Exercises        Assessment/Plan    PT Assessment Patient needs continued PT services  PT Diagnosis Difficulty walking;Acute pain   PT Problem List Decreased strength;Decreased range of motion;Decreased activity tolerance;Decreased balance;Decreased mobility;Pain;Decreased knowledge of use of DME;Decreased knowledge of precautions  PT Treatment Interventions DME instruction;Gait training;Functional mobility training;Therapeutic activities;Therapeutic exercise;Patient/family education;Balance training   PT Goals (Current goals can be found in the Care Plan section) Acute Rehab PT  Goals Patient Stated Goal: regain independence PT Goal Formulation: With patient Time For Goal Achievement: 07/12/13 Potential to Achieve Goals: Good    Frequency 7X/week   Barriers to discharge        Co-evaluation               End of Session   Activity Tolerance: Patient tolerated treatment well Patient left: in chair;with call bell/phone within reach           Time: 0908-0942 PT Time Calculation (min): 34 min   Charges:   PT Evaluation $Initial PT Evaluation Tier  I: 1 Procedure PT Treatments $Gait Training: 8-22 mins $Therapeutic Activity: 8-22 mins   PT G Codes:          Weston Anna, MPT Pager: [phone]

## 2013-07-05 NOTE — Evaluation (Signed)
Occupational Therapy Evaluation Patient Details Name: [name] MRN: [id number] DOB: 31-Mar-1950 Today's Date: 07/05/2013    History of Present Illness 63 yo female s/p R THA revision-posterior approach 6/22.    Clinical Impression   Pt is at sup - min guard A level with ADL mobility and min A with LB ADLs. Pt familiar with DME and ADL A/E form previous hip surgery ad will have assist at home from her husband. All education completed and no further acute OT services indicated at this time    Follow Up Recommendations  No OT follow up    Equipment Recommendations  3 in 1 bedside comode    Recommendations for Other Services       Precautions / Restrictions Precautions Precautions: Posterior Hip Precaution Comments: able to recall 3/3 hip precautions Restrictions Weight Bearing Restrictions: No RLE Weight Bearing: Weight bearing as tolerated      Mobility Bed Mobility Overal bed mobility: Needs Assistance Bed Mobility: Supine to Sit     Supine to sit: Min assist     General bed mobility comments: pt up in recliner upon entering room  Transfers Overall transfer level: Needs assistance Equipment used: Rolling walker (2 wheeled) Transfers: Sit to/from Stand Sit to Stand: Supervision         General transfer comment: VCs safety, hand placement    Balance Overall balance assessment: No apparent balance deficits (not formally assessed)                                          ADL Overall ADL's : Needs assistance/impaired     Grooming: Wash/dry hands;Wash/dry face;Supervision/safety;Set up;Standing   Upper Body Bathing: Supervision/ safety;Set up;Standing   Lower Body Bathing: Minimal assistance;Set up;Sit to/from stand;Sitting/lateral leans;With adaptive equipment   Upper Body Dressing : Set up;Supervision/safety;Standing   Lower Body Dressing: Sit to/from stand;Sitting/lateral leans;Minimal assistance;Set up     Toilet Transfer  Details (indicate cue type and reason): VCs safety, hand placement Toileting- Clothing Manipulation and Hygiene: Min guard;Sit to/from stand   Tub/ Banker: Supervision/safety;3 in 1   Functional mobility during ADLs: Supervision/safety;Min guard General ADL Comments: Rviewed ADL A/E with     Vision  wears glasses for reading                   Perception Perception Perception Tested?: No   Praxis Praxis Praxis tested?: Not tested    Pertinent Vitals/Pain 3/10 pain reported, VSS     Hand Dominance Right   Extremity/Trunk Assessment Upper Extremity Assessment Upper Extremity Assessment: Overall WFL for tasks assessed   Lower Extremity Assessment Lower Extremity Assessment: Defer to PT evaluation RLE Deficits / Details: hip flex 2/5, hip abd/add 2/5, moves ankle well   Cervical / Trunk Assessment Cervical / Trunk Assessment: Normal   Communication Communication Communication: No difficulties   Cognition Arousal/Alertness: Awake/alert Behavior During Therapy: WFL for tasks assessed/performed Overall Cognitive Status: Within Functional Limits for tasks assessed                     General Comments   Pt very pleasant and cooperative                 Home Living Family/patient expects to be discharged to:: Private residence     Type of Home: House Home Access: Ramped entrance     Home Layout: One level  Bathroom Shower/Tub: Tub/shower unit         Home Equipment: Environmental consultant - 2 wheels;Cane - single point;Bedside commode;Adaptive equipment Adaptive Equipment: [place] Additional Comments: borrowed walker      Prior Functioning/Environment Level of Independence: Independent                             OT Goals(Current goals can be found in the care plan section) Acute Rehab OT Goals Patient Stated Goal: regain independence OT Goal Formulation: With patient       Barriers to D/C:  none                         End of Session Equipment Utilized During Treatment: Rolling walker;Other (comment) (3 in 1 over toilet)  Activity Tolerance: Patient tolerated treatment well Patient left: in chair   Time: 1009-1041 OT Time Calculation (min): 32 min Charges:  OT General Charges $OT Visit: 1 Procedure OT Evaluation $Initial OT Evaluation Tier I: 1 Procedure OT Treatments $Self Care/Home Management : 8-22 mins $Therapeutic Activity: 8-22 mins G-Codes:    Britt Bottom 07/05/2013, 1:39 PM

## 2013-07-05 NOTE — Progress Notes (Signed)
   Subjective: 1 Day Post-Op Procedure(s) (LRB): RIGHT TOTAL HIP REVISION (Right) Patient reports pain as mild.   Patient seen in rounds with Dr. Wynelle Link.  ":Not bad" with regards to pain.  Husband in room at bedside. Patient is well, but has had some minor complaints of pain in the hip, requiring pain medications Patient is ready to go home today.  Will undergo two session of therapy and then home as long as she meets her goals.  Objective: Vital signs in last 24 hours: Temp:  [97.6 F (36.4 C)-98.3 F (36.8 C)] 97.9 F (36.6 C) (06/23 0545) Pulse Rate:  [59-75] 60 (06/23 0545) Resp:  [6-20] 16 (06/23 0545) BP: (101-130)/(64-80) 103/68 mmHg (06/23 0545) SpO2:  [99 %-100 %] 100 % (06/23 0545) Weight:  [60.782 kg (134 lb)] 60.782 kg (134 lb) (06/22 1500)  Intake/Output from previous day:  Intake/Output Summary (Last 24 hours) at 07/05/13 0751 Last data filed at 07/05/13 0545  Gross per 24 hour  Intake 4626.25 ml  Output   4410 ml  Net 216.25 ml    Labs:  Recent Labs  07/04/13 0900 07/05/13 0429  HGB 13.6 12.5    Recent Labs  07/04/13 0900 07/05/13 0429  WBC 3.8* 13.8*  RBC 4.26 3.99  HCT 40.8 38.6  PLT 283 265    Recent Labs  07/05/13 0429  NA 139  K 4.4  CL 102  CO2 29  BUN 8  CREATININE 0.62  GLUCOSE 149*  CALCIUM 9.0   No results found for this basename: LABPT, INR,  in the last 72 hours  EXAM: General - Patient is Alert, Appropriate and Oriented Extremity - Neurovascular intact Sensation intact distally Dorsiflexion/Plantar flexion intact Dressing - clean, dry Motor Function - intact, moving foot and toes well on exam.   Assessment/Plan: 1 Day Post-Op Procedure(s) (LRB): RIGHT TOTAL HIP REVISION (Right) Procedure(s) (LRB): RIGHT TOTAL HIP REVISION (Right) Past Medical History  Diagnosis Date  . Hx of seasonal allergies   . H/O tinnitus     bilateral mild  . Arthritis     osteoarthritis, osteoporosis. Past hx. of skin staph  infections-no problems at present  . 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 H/O oophorectomy     '99   Principal Problem:   Failed total hip arthroplasty  Estimated body mass index is 22.99 kg/(m^2) as calculated from the following:   Height as of this encounter: 5\' 4"  (1.626 m).   Weight as of this encounter: 60.782 kg (134 lb). Up with therapy Discharge home with home health Diet - Regular diet Follow up - in 2 weeks Activity - WBAT Disposition - Home Condition Upon Discharge - Good D/C Meds - See DC Summary DVT Prophylaxis - Xarelto  Arlee Muslim, PA-C Orthopaedic Surgery 07/05/2013, 7:51 AM

## 2013-07-05 NOTE — Progress Notes (Signed)
Physical Therapy Treatment Patient Details Name: [name] MRN: [id number] DOB: 1950-03-08 Today's Date: 07/05/2013    History of Present Illness 63 yo female s/p R THA revision-posterior approach 6/22.     PT Comments    Progressing well with mobility. Reviewed hip precautions, exercises, ambulation, car transfer. Husband present as well. All education completed. Ready to d/c from PT standpoint.   Follow Up Recommendations  Home health PT     Equipment Recommendations  None recommended by PT    Recommendations for Other Services OT consult     Precautions / Restrictions Precautions Precautions: Posterior Hip Precaution Comments: reviewed/demonstrated hip precautions. Pt able to recall 3/3 Restrictions Weight Bearing Restrictions: No RLE Weight Bearing: Weight bearing as tolerated    Mobility  Bed Mobility Overal bed mobility: Needs Assistance Bed Mobility: Supine to Sit     Supine to sit: Min assist     General bed mobility comments: assist for R LE off bed. VCs safety, technique, hand placement  Transfers Overall transfer level: Needs assistance Equipment used: Rolling walker (2 wheeled) Transfers: Sit to/from Stand Sit to Stand: Supervision         General transfer comment: VCs safety, hand placement  Ambulation/Gait Ambulation/Gait assistance: Supervision Ambulation Distance (Feet): 150 Feet Assistive device: Rolling walker (2 wheeled) Gait Pattern/deviations: Step-to pattern;Decreased stride length     General Gait Details: VCs safety.    Stairs            Wheelchair Mobility    Modified Rankin (Stroke Patients Only)       Balance                                    Cognition                            Exercises Total Joint Exercises Ankle Circles/Pumps: AROM;Both;10 reps;Seated Quad Sets: AROM;Both;10 reps;Seated Heel Slides: AAROM;Right;10 reps;Seated Hip ABduction/ADduction: AAROM;Right;10  reps;Seated    General Comments        Pertinent Vitals/Pain 3/10 R hip with activity. Ice applied end of session    [place] expects to be discharged to:: Private residence     Type of Home: [place]: One [place]: Environmental consultant - 2 wheels;Cane - single point;Bedside commode;Adaptive equipment Additional Comments: borrowed walker    Prior Function Level of Independence: Independent          PT Goals (current goals can now be found in the care plan section) Acute Rehab PT Goals Patient Stated Goal: regain independence PT Goal Formulation: With patient Time For Goal Achievement: 07/12/13 Potential to Achieve Goals: Good Progress towards PT goals: Progressing toward goals    Frequency  7X/week    PT Plan Current plan remains appropriate    Co-evaluation             End of Session   Activity Tolerance: Patient tolerated treatment well Patient left: in chair;with call bell/phone within reach;with family/visitor present     Time: 8938-1017 PT Time Calculation (min): 20 min  Charges:  $Gait Training: 8-22 mins $Therapeutic Activity: 8-22 mins                    G Codes:      Weston Anna, MPT Pager: [phone]

## 2013-07-05 NOTE — Discharge Instructions (Addendum)
Dr. Gaynelle Arabian Total Joint Specialist [facility] [address], [place], [place] [address] [phone]   TOTAL HIP REPLACEMENT POSTOPERATIVE DIRECTIONS    Hip Rehabilitation, Guidelines Following Surgery  The results of a hip operation are greatly improved after range of motion and muscle strengthening exercises. Follow all safety measures which are given to protect your hip. If any of these exercises cause increased pain or swelling in your joint, decrease the amount until you are comfortable again. Then slowly increase the exercises. Call your caregiver if you have problems or questions.  HOME CARE INSTRUCTIONS  Most of the following instructions are designed to prevent the dislocation of your new hip.  Remove items at home which could result in a fall. This includes throw rugs or furniture in walking pathways.  Continue medications as instructed at time of discharge.  You may have some home medications which will be placed on hold until you complete the course of blood thinner medication.  You may start showering once you are discharged home but do not submerge the incision under water. Just pat the incision dry and apply a dry gauze dressing on daily. Do not put on socks or shoes without following the instructions of your caregivers.  Sit on high chairs so your hips are not bent more than 90 degrees.  Sit on chairs with arms. Use the chair arms to help push yourself up when arising.  Keep your leg on the side of the operation out in front of you when standing up.  Arrange for the use of a toilet seat elevator so you are not sitting low.  Do not do any exercises or get in any positions that cause your toes to point in (pigeon toed).  Always sleep with a pillow between your legs. Do not lie on your side in sleep with both knees touching the bed.   Walk with walker as instructed.  You may resume a sexual relationship in one month or when given the OK by  your caregiver.  Use walker as long as suggested by your caregivers.  You may put full weight on your legs and walk as much as is comfortable. Avoid periods of inactivity such as sitting longer than an hour when not asleep. This helps prevent blood clots.  You may return to work once you are cleared by Engineer, production.  Do not drive a car for 6 weeks or until released by your surgeon.  Do not drive while taking narcotics.  Wear elastic stockings for three weeks following surgery during the day but you may remove then at night.  Make sure you keep all of your appointments after your operation with all of your doctors and caregivers. You should call the office at the above phone number and make an appointment for approximately two weeks after the date of your surgery. Change the dressing daily and reapply a dry dressing each time. Please pick up a stool softener and laxative for home use as long as you are requiring pain medications.  Continue to use ice on the hip for pain and swelling from surgery. You may notice swelling that will progress down to the foot and ankle.  This is normal after  surgery.  Elevate the leg when you are not up walking on it.   It is important for you to complete the blood thinner medication as prescribed by your doctor.  Continue to use the breathing machine which will help keep your temperature down.  It  is common for your temperature to cycle up and down following surgery, especially at night when you are not up moving around and exerting yourself.  The breathing machine keeps your lungs expanded and your temperature down.  RANGE OF MOTION AND STRENGTHENING EXERCISES  These exercises are designed to help you keep full movement of your hip joint. Follow your caregiver's or physical therapist's instructions. Perform all exercises about fifteen times, three times per day or as directed. Exercise both hips, even if you have had only one joint replacement. These exercises can be  done on a training (exercise) mat, on the floor, on a table or on a bed. Use whatever works the best and is most comfortable for you. Use music or television while you are exercising so that the exercises are a pleasant break in your day. This will make your life better with the exercises acting as a break in routine you can look forward to.  Lying on your back, slowly slide your foot toward your buttocks, raising your knee up off the floor. Then slowly slide your foot back down until your leg is straight again.  Lying on your back spread your legs as far apart as you can without causing discomfort.  Lying on your side, raise your upper leg and foot straight up from the floor as far as is comfortable. Slowly lower the leg and repeat.  Lying on your back, tighten up the muscle in the front of your thigh (quadriceps muscles). You can do this by keeping your leg straight and trying to raise your heel off the floor. This helps strengthen the largest muscle supporting your knee.  Lying on your back, tighten up the muscles of your buttocks both with the legs straight and with the knee bent at a comfortable angle while keeping your heel on the floor.   SKILLED REHAB INSTRUCTIONS: If the patient is transferred to a skilled rehab facility following release from the hospital, a list of the current medications will be sent to the facility for the patient to continue.  When discharged from the skilled rehab facility, please have the facility set up the patient's [place] prior to being released. Also, the skilled facility will be responsible for providing the patient with their medications at time of release from the facility to include their pain medication, the muscle relaxants, and their blood thinner medication. If the patient is still at the rehab facility at time of the two week follow up appointment, the skilled rehab facility will also need to assist the patient in arranging follow up  appointment in our office and any transportation needs.  MAKE SURE YOU:  Understand these instructions.  Will watch your condition.  Will get help right away if you are not doing well or get worse.  Pick up stool softner and laxative for home. Do not submerge incision under water. May shower. Continue to use ice for pain and swelling from surgery. Hip precautions.  Total Hip Protocol.  Take Xarelto for two and a half more weeks, then discontinue Xarelto. Once the patient has completed the blood thinner regimen, then take a Baby 81 mg Aspirin daily for three more weeks.  Information on my medicine - XARELTO (Rivaroxaban)  This medication education was reviewed with me or my healthcare representative as part of my discharge preparation.  The pharmacist that spoke with me during my hospital stay was:  Leeroy Bock, [facility]  Why was Xarelto prescribed for you? Xarelto was prescribed  for you to reduce the risk of blood clots forming after orthopedic surgery. The medical term for these abnormal blood clots is venous thromboembolism (VTE).  What do you need to know about xarelto ? Take your Xarelto ONCE DAILY at the same time every day. You may take it either with or without food.  If you have difficulty swallowing the tablet whole, you may crush it and mix in applesauce just prior to taking your dose.  Take Xarelto exactly as prescribed by your doctor and DO NOT stop taking Xarelto without talking to the doctor who prescribed the medication.  Stopping without other VTE prevention medication to take the place of Xarelto may increase your risk of developing a clot.  After discharge, you should have regular check-up appointments with your healthcare provider that is prescribing your Xarelto.    What do you do if you miss a dose? If you miss a dose, take it as soon as you remember on the same day then continue your regularly scheduled once daily regimen the next day. Do  not take two doses of Xarelto on the same day.   Important Safety Information A possible side effect of Xarelto is bleeding. You should call your healthcare provider right away if you experience any of the following:   Bleeding from an injury or your nose that does not stop.   Unusual colored urine (red or dark brown) or unusual colored stools (red or black).   Unusual bruising for unknown reasons.   A serious fall or if you hit your head (even if there is no bleeding).  Some medicines may interact with Xarelto and might increase your risk of bleeding while on Xarelto. To help avoid this, consult your healthcare provider or pharmacist prior to using any new prescription or non-prescription medications, including herbals, vitamins, non-steroidal anti-inflammatory drugs (NSAIDs) and supplements.  This website has more information on Xarelto: [email or web address]

## 2013-07-05 NOTE — Op Note (Signed)
[name], [name] NO.:  [id number]  MEDICAL RECORD NO.:  [id number]  LOCATION:  0093                         FACILITY:  [facility]  PHYSICIAN:  Gaynelle Arabian, M.D.    DATE OF BIRTH:  Dec 18, 1950  DATE OF PROCEDURE:  07/04/2013 DATE OF DISCHARGE:                              OPERATIVE REPORT   PREOPERATIVE DIAGNOSIS:  Right hip bearing surface failure.  POSTOPERATIVE DIAGNOSIS:  Right hip bearing surface failure.  PROCEDURE:  Right total hip arthroplasty revision.  SURGEON:  Gaynelle Arabian, M.D.  ASSISTANT:  Ardeen Jourdain, [place].  ANESTHESIA:  Spinal.  ESTIMATED BLOOD LOSS:  200 mL.  DRAINS:  Hemovac x1.  COMPLICATIONS:  None.  CONDITION:  Stable to recovery.  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  PROCEDURE IN DETAIL:  After successful administration of spinal anesthetic, she was placed on the left lateral decubitus position to right side up, and held with the hip positioner.  Right lower extremity was isolated from her perineum with sterile drapes and prepped and draped in the usual sterile fashion.  Her previous posterolateral incisions were utilized.  Skin cut with a #10 blade through subcutaneous tissue to the level of the fascia lata, which was incised in line with the skin incision.  Sciatic nerve was palpated and protected and a short rotators were then dissected off the posterior femur.  There was evidence of some fluid in the joint, which was clear.  There is also metal stained tissue in the joint.  I  debrided this tissue.  There was no gross damage to the femoral head.  I dislocated the hip and we removed the femoral head inspecting without any gross damage.  There was mild corrosion on the femoral neck and given through some corrosion and given that I wanted to change the ceramic head, we decided to remove the stem.  This was an SROM stem, so the sleeve was to stay intact and we were going to remove the stem.  The chisel device was then placed at the junction of the stem and sleeve to disrupt the interface.  I was then able to be easily remove the stem.  The femur was then retracted anteriorly to gain acetabular exposure.  I removed soft tissue from the acetabular rim.  The acetabular retractors were placed.  The metal liner was then removed from the acetabular shell.  The shell was in excellent position and was well fixed.  There was a 36-mm metal liner for a 50 mm cup.  We then replaced this with a 32-mm neutral +4 marathon liner for the 50 mm cup.  It was impacted and is a stable fit.  We then placed the reamers, starting with a 8-mm reamer through the femoral sleeve into  the femoral canal.  I reamed up to 13.5 mm.  We then placed 18 x 13 stem with 36+8 neck, 10 degrees beyond the patient's native anteversion.  This was the position of her previous stem.  A 32+0 trial head was placed and with 32+0, the hip was reduced with excellent stability, full extension, full external rotation, 70 degrees flexion, 40 degrees adduction, 90 degrees internal rotation, 90 degrees of flexion and 70 degrees of internal rotation.  I placed right leg on top of the left, the leg lengths now equalized.  She has had few millimeters long previously.  We dislocated the hip and removed the trial head and placed the permanent 32.0 ceramic head.  The hip was then reduced with the same stability parameters.  Wound was copiously irrigated with saline solution, and then the posterior tissues reattached to the  femur through drill holes with Ethibond suture.  A 20 mL of Exparel mixed with 30 mL of saline was then injected into gluteal muscles, fascia lata, and subcu tissues.  Additional 20 mL of 0.25% Marcaine was injected into the same tissues.  Fascia lata was then closed over Hemovac drain with running #1 V-Loc suture.  Subcu was closed with interrupted 2-0 Vicryl and subcuticular running 4-0 Monocryl.  Incisions were cleaned and dried and Steri-Strips and a bulky sterile dressing applied.  She was subsequently awakened and transported to recovery in stable condition.  Note that a surgical assistant was a medical necessity for this procedure to do it in a safe and expeditious manner.  Surgical assistant was necessary for retraction of vital neurovascular structures and for proper positioning of the limb to allow for removal of the old prosthesis and for accurate placement of the new prosthesis.     Gaynelle Arabian, M.D.     FA/MEDQ  D:  07/04/2013  T:  07/05/2013  Job:  [id number]

## 2013-08-12 DIAGNOSIS — Z0289 Encounter for other administrative examinations: Secondary | ICD-10-CM

## 2013-10-04 ENCOUNTER — Encounter: Payer: Self-pay | Admitting: Internal Medicine

## 2013-11-22 ENCOUNTER — Ambulatory Visit (AMBULATORY_SURGERY_CENTER): Payer: Self-pay | Admitting: *Deleted

## 2013-11-22 VITALS — Ht 64.0 in | Wt 135.2 lb

## 2013-11-22 DIAGNOSIS — Z1211 Encounter for screening for malignant neoplasm of colon: Secondary | ICD-10-CM

## 2013-11-22 MED ORDER — MOVIPREP 100 G PO SOLR: 1.0000 | Freq: Once | ORAL | Status: DC

## 2013-11-22 NOTE — Progress Notes (Signed)
Pt states she wants light sedation, even with her propofol. ewm No egg or soy allergy. ewm No blood thinners, no diet pills. ewm No issues with sedation. ewm Last colon in epic with Dr Penelope Coop, normal. 11-13-2003 Pt states she takes fiber daily and occ miralax. She states her stools are soft, not hard. Instructed pt to use miralax daily the 5 days before her colon since she cannot take the fiber. ewm

## 2013-12-06 ENCOUNTER — Encounter: Payer: Self-pay | Admitting: Internal Medicine

## 2013-12-06 ENCOUNTER — Ambulatory Visit (AMBULATORY_SURGERY_CENTER): Payer: 59 | Admitting: Internal Medicine

## 2013-12-06 VITALS — BP 116/81 | HR 62 | Temp 96.7°F | Resp 18 | Ht 64.0 in | Wt 135.0 lb

## 2013-12-06 DIAGNOSIS — Z1211 Encounter for screening for malignant neoplasm of colon: Secondary | ICD-10-CM

## 2013-12-06 MED ORDER — SODIUM CHLORIDE 0.9 % IV SOLN: 500.0000 mL | INTRAVENOUS | Status: DC

## 2013-12-06 NOTE — Op Note (Signed)
[facility]  [address] [place], [address]   COLONOSCOPY PROCEDURE REPORT  PATIENT: [name], [name]  MR#: [id number] BIRTHDATE: 1950/01/28 , 62  yrs. old GENDER: female ENDOSCOPIST: Lafayette Dragon, MD REFERRED BY:Dr Cammie Fulp PROCEDURE DATE:  12/06/2013 PROCEDURE:   Colonoscopy, screening First Screening Colonoscopy - Avg.  risk and is 50 yrs.  old or older - No.  Prior Negative Screening - Now for repeat screening. 10 or more years since last screening  History of Adenoma - Now for follow-up colonoscopy & has been > or = to 3 yrs.  N/A  Polyps Removed Today? No. ASA CLASS:   Class II INDICATIONS:average risk for colon cancer and prior colonoscopy in 2005 by Dr.Ganem-records not available but patient was told that she had a normal exam. MEDICATIONS: Monitored anesthesia care and Propofol 300 mg IV  DESCRIPTION OF PROCEDURE:   After the risks benefits and alternatives of the procedure were thoroughly explained, informed consent was obtained.  The digital rectal exam revealed no abnormalities of the rectum.   The LB [id number]-[id number]  endoscope was introduced through the anus and advanced to the cecum, which was identified by both the appendix and ileocecal valve. No adverse events experienced.   The quality of the prep was good, using MoviPrep  The instrument was then slowly withdrawn as the colon was fully examined.      COLON FINDINGS: There was mild diverticulosis noted in the sigmoid colon.  Retroflexed views revealed no abnormalities. The time to cecum=9 minutes 08 seconds.  Withdrawal time=6 minutes 02 seconds. The scope was withdrawn and the procedure completed. COMPLICATIONS: There were no immediate complications.  ENDOSCOPIC IMPRESSION: Mild diverticulosis was noted in the sigmoid colon  RECOMMENDATIONS: Hhigh fiber diet Recall colonoscopy in 10 years  eSigned:  Lafayette Dragon, MD 12/06/2013 8:32 AM   cc:   PATIENT NAME:  Lamiracle, Chaidez MR#: [id number]

## 2013-12-06 NOTE — Patient Instructions (Signed)
Impressions/recommendations:  Diverticulosis (handout given) High Fiber diet (handout given)  Repeat colonoscopy in 10 years.  YOU HAD AN ENDOSCOPIC PROCEDURE TODAY AT THE [facility]: Refer to the procedure report that was given to you for any specific questions about what was found during the examination.  If the procedure report does not answer your questions, please call your gastroenterologist to clarify.  If you requested that your care partner not be given the details of your procedure findings, then the procedure report has been included in a sealed envelope for you to review at your convenience later.  YOU SHOULD EXPECT: Some feelings of bloating in the abdomen. Passage of more gas than usual.  Walking can help get rid of the air that was put into your GI tract during the procedure and reduce the bloating. If you had a lower endoscopy (such as a colonoscopy or flexible sigmoidoscopy) you may notice spotting of blood in your stool or on the toilet paper. If you underwent a bowel prep for your procedure, then you may not have a normal bowel movement for a few days.  DIET: Your first meal following the procedure should be a light meal and then it is ok to progress to your normal diet.  A half-sandwich or bowl of soup is an example of a good first meal.  Heavy or fried foods are harder to digest and may make you feel nauseous or bloated.  Likewise meals heavy in dairy and vegetables can cause extra gas to form and this can also increase the bloating.  Drink plenty of fluids but you should avoid alcoholic beverages for 24 hours.  ACTIVITY: Your care partner should take you home directly after the procedure.  You should plan to take it easy, moving slowly for the rest of the day.  You can resume normal activity the day after the procedure however you should NOT DRIVE or use heavy machinery for 24 hours (because of the sedation medicines used during the test).    SYMPTOMS TO REPORT  IMMEDIATELY: A gastroenterologist can be reached at any hour.  During normal business hours, 8:30 AM to 5:00 PM Monday through Friday, call [phone].  After hours and on weekends, please call the GI answering service at [phone] who will take a message and have the physician on call contact you.   Following lower endoscopy (colonoscopy or flexible sigmoidoscopy):  Excessive amounts of blood in the stool  Significant tenderness or worsening of abdominal pains  Swelling of the abdomen that is new, acute  Fever of 100F or higher  FOLLOW UP: If any biopsies were taken you will be contacted by phone or by letter within the next 1-3 weeks.  Call your gastroenterologist if you have not heard about the biopsies in 3 weeks.  Our staff will call the home number listed on your records the next business day following your procedure to check on you and address any questions or concerns that you may have at that time regarding the information given to you following your procedure. This is a courtesy call and so if there is no answer at the home number and we have not heard from you through the emergency physician on call, we will assume that you have returned to your regular daily activities without incident.  SIGNATURES/CONFIDENTIALITY: You and/or your care partner have signed paperwork which will be entered into your electronic medical record.  These signatures attest to the fact that that the information above on your   your After Visit Summary has been reviewed and is understood.  Full responsibility of the confidentiality of this discharge information lies with you and/or your care-partner. 

## 2013-12-07 ENCOUNTER — Telehealth: Payer: Self-pay | Admitting: *Deleted

## 2013-12-07 NOTE — Telephone Encounter (Signed)
  Follow up Call-  Call back number 12/06/2013  Post procedure Call Back phone  # [phone]  Permission to leave phone message Yes     No answer, left message.

## 2015-06-26 ENCOUNTER — Other Ambulatory Visit: Payer: Self-pay | Admitting: Family Medicine

## 2015-06-26 ENCOUNTER — Ambulatory Visit
Admission: RE | Admit: 2015-06-26 | Discharge: 2015-06-26 | Disposition: A | Discharge status: Home / Self Care | Payer: Commercial Managed Care - HMO | Source: Ambulatory Visit | Attending: Family Medicine | Admitting: Family Medicine

## 2015-06-26 DIAGNOSIS — M79675 Pain in left toe(s): Secondary | ICD-10-CM

## 2015-06-26 DIAGNOSIS — M545 Low back pain: Secondary | ICD-10-CM

## 2015-06-26 DIAGNOSIS — G8929 Other chronic pain: Secondary | ICD-10-CM

## 2015-06-26 DIAGNOSIS — M533 Sacrococcygeal disorders, not elsewhere classified: Principal | ICD-10-CM

## 2016-07-14 NOTE — Progress Notes (Signed)
Please place orders in EPIC as patient has a pre-op appointment on 07/28/2016! Thank you!

## 2016-07-22 ENCOUNTER — Ambulatory Visit: Payer: Self-pay | Admitting: Orthopedic Surgery

## 2016-07-25 NOTE — Patient Instructions (Addendum)
[name]  07/25/2016   Your procedure is scheduled on: 08-06-16   Report to [facility] Main  Entrance Report to Admitting at 6:30 AM   Call this number if you have problems the morning of surgery  [phone]   Remember: ONLY 1 PERSON MAY GO WITH YOU TO SHORT STAY TO GET  READY MORNING OF [name].  Do not eat food or drink liquids :After Midnight.     Take these medicines the morning of surgery with A SIP OF WATER: Fexofenadine (Allegra)                               You may not have any metal on your body including hair pins and              piercings  Do not wear jewelry, make-up, lotions, powders or perfumes, deodorant             Do not wear nail polish.  Do not shave  48 hours prior to surgery.                Do not bring valuables to the hospital. [name].  Contacts, dentures or bridgework may not be worn into surgery.  Leave suitcase in the car. After surgery it may be brought to your room.                 Please read over the following fact sheets you were given: _____________________________________________________________________             [facility] - Preparing for Surgery Before surgery, you can play an important role.  Because skin is not sterile, your skin needs to be as free of germs as possible.  You can reduce the number of germs on your skin by washing with CHG (chlorahexidine gluconate) soap before surgery.  CHG is an antiseptic cleaner which kills germs and bonds with the skin to continue killing germs even after washing. Please DO NOT use if you have an allergy to CHG or antibacterial soaps.  If your skin becomes reddened/irritated stop using the CHG and inform your nurse when you arrive at Short Stay. Do not shave (including legs and underarms) for at least 48 hours prior to the first CHG shower.  You may shave your face/neck. Please follow these instructions  carefully:  1.  Shower with CHG Soap the night before surgery and the  morning of Surgery.  2.  If you choose to wash your hair, wash your hair first as usual with your  normal  shampoo.  3.  After you shampoo, rinse your hair and body thoroughly to remove the  shampoo.                           4.  Use CHG as you would any other liquid soap.  You can apply chg directly  to the skin and wash                       Gently with a scrungie or clean washcloth.  5.  Apply the CHG Soap to your body ONLY FROM THE NECK DOWN.   Do not use  on face/ open                           Wound or open sores. Avoid contact with eyes, ears mouth and genitals (private parts).                       Wash face,  Genitals (private parts) with your normal soap.             6.  Wash thoroughly, paying special attention to the area where your surgery  will be performed.  7.  Thoroughly rinse your body with warm water from the neck down.  8.  DO NOT shower/wash with your normal soap after using and rinsing off  the CHG Soap.                9.  Pat yourself dry with a clean towel.            10.  Wear clean pajamas.            11.  Place clean sheets on your bed the night of your first shower and do not  sleep with pets. Day of Surgery : Do not apply any lotions/deodorants the morning of surgery.  Please wear clean clothes to the hospital/surgery center.  FAILURE TO FOLLOW THESE INSTRUCTIONS MAY RESULT IN THE CANCELLATION OF YOUR SURGERY PATIENT SIGNATURE_________________________________  NURSE SIGNATURE__________________________________  ________________________________________________________________________   [name]  An incentive spirometer is a tool that can help keep your lungs clear and active. This tool measures how well you are filling your lungs with each breath. Taking long deep breaths may help reverse or decrease the chance of developing breathing (pulmonary) problems (especially infection)  following:  A long period of time when you are unable to move or be active. BEFORE THE PROCEDURE   If the spirometer includes an indicator to show your best effort, your nurse or respiratory therapist will set it to a desired goal.  If possible, sit up straight or lean slightly forward. Try not to slouch.  Hold the incentive spirometer in an upright position. INSTRUCTIONS FOR USE  1. Sit on the edge of your bed if possible, or sit up as far as you can in bed or on a chair. 2. Hold the incentive spirometer in an upright position. 3. Breathe out normally. 4. Place the mouthpiece in your mouth and seal your lips tightly around it. 5. Breathe in slowly and as deeply as possible, raising the piston or the ball toward the top of the column. 6. Hold your breath for 3-5 seconds or for as long as possible. Allow the piston or ball to fall to the bottom of the column. 7. Remove the mouthpiece from your mouth and breathe out normally. 8. Rest for a few seconds and repeat Steps 1 through 7 at least 10 times every 1-2 hours when you are awake. Take your time and take a few normal breaths between deep breaths. 9. The spirometer may include an indicator to show your best effort. Use the indicator as a goal to work toward during each repetition. 10. After each set of 10 deep breaths, practice coughing to be sure your lungs are clear. If you have an incision (the cut made at the time of surgery), support your incision when coughing by placing a pillow or rolled up towels firmly against it. Once you are able to get out of bed, walk around  indoors and cough well. You may stop using the incentive spirometer when instructed by your caregiver.  RISKS AND COMPLICATIONS  Take your time so you do not get dizzy or light-headed.  If you are in pain, you may need to take or ask for pain medication before doing incentive spirometry. It is harder to take a deep breath if you are having pain. AFTER USE  Rest and  breathe slowly and easily.  It can be helpful to keep track of a log of your progress. Your caregiver can provide you with a simple table to help with this. If you are using the spirometer at home, follow these instructions: [name] IF:   You are having difficultly using the spirometer.  You have trouble using the spirometer as often as instructed.  Your pain medication is not giving enough relief while using the spirometer.  You develop fever of 100.5 F (38.1 C) or higher. SEEK IMMEDIATE MEDICAL CARE IF:   You cough up bloody sputum that had not been present before.  You develop fever of 102 F (38.9 C) or greater.  You develop worsening pain at or near the incision site. MAKE SURE YOU:   Understand these instructions.  Will watch your condition.  Will get help right away if you are not doing well or get worse. Document Released: 05/12/2006 Document Revised: 03/24/2011 Document Reviewed: 07/13/2006 [facility] Patient Information 2014 [place], [place].    WHAT IS A BLOOD TRANSFUSION? Blood Transfusion Information  A transfusion is the replacement of blood or some of its parts. Blood is made up of multiple cells which provide different functions.  Red blood cells carry oxygen and are used for blood loss replacement.  White blood cells fight against infection.  Platelets control bleeding.  Plasma helps clot blood.  Other blood products are available for specialized needs, such as hemophilia or other clotting disorders. BEFORE THE TRANSFUSION  Who gives blood for transfusions?   Healthy volunteers who are fully evaluated to make sure their blood is safe. This is blood bank blood. Transfusion therapy is the safest it has ever been in the practice of medicine. Before blood is taken from a donor, a complete history is taken to make sure that person has no history of diseases nor engages in risky social behavior (examples are intravenous drug use or sexual activity  with multiple partners). The donor's travel history is screened to minimize risk of transmitting infections, such as malaria. The donated blood is tested for signs of infectious diseases, such as HIV and hepatitis. The blood is then tested to be sure it is compatible with you in order to minimize the chance of a transfusion reaction. If you or a relative donates blood, this is often done in anticipation of surgery and is not appropriate for emergency situations. It takes many days to process the donated blood. RISKS AND COMPLICATIONS Although transfusion therapy is very safe and saves many lives, the main dangers of transfusion include:   Getting an infectious disease.  Developing a transfusion reaction. This is an allergic reaction to something in the blood you were given. Every precaution is taken to prevent this. The decision to have a blood transfusion has been considered carefully by your caregiver before blood is given. Blood is not given unless the benefits outweigh the risks. AFTER THE TRANSFUSION  Right after receiving a blood transfusion, you will usually feel much better and more energetic. This is especially true if your red blood cells have gotten low (  anemic). The transfusion raises the level of the red blood cells which carry oxygen, and this usually causes an energy increase.  The nurse administering the transfusion will monitor you carefully for complications. HOME CARE INSTRUCTIONS  No special instructions are needed after a transfusion. You may find your energy is better. Speak with your caregiver about any limitations on activity for underlying diseases you may have. SEEK MEDICAL CARE IF:   Your condition is not improving after your transfusion.  You develop redness or irritation at the intravenous (IV) site. SEEK IMMEDIATE MEDICAL CARE IF:  Any of the following symptoms occur over the next 12 hours:  Shaking chills.  You have a temperature by mouth above 102 F (38.9  C), not controlled by medicine.  Chest, back, or muscle pain.  People around you feel you are not acting correctly or are confused.  Shortness of breath or difficulty breathing.  Dizziness and fainting.  You get a rash or develop hives.  You have a decrease in urine output.  Your urine turns a dark color or changes to pink, red, or brown. Any of the following symptoms occur over the next 10 days:  You have a temperature by mouth above 102 F (38.9 C), not controlled by medicine.  Shortness of breath.  Weakness after normal activity.  The white part of the eye turns yellow (jaundice).  You have a decrease in the amount of urine or are urinating less often.  Your urine turns a dark color or changes to pink, red, or brown. Document Released: 12/28/1999 Document Revised: 03/24/2011 Document Reviewed: 08/16/2007 [facility] Patient Information 2014 [place], [place].  _______________________________________________________________________

## 2016-07-28 ENCOUNTER — Encounter (HOSPITAL_COMMUNITY): Payer: Self-pay

## 2016-07-28 ENCOUNTER — Encounter (HOSPITAL_COMMUNITY)
Admission: RE | Admit: 2016-07-28 | Discharge: 2016-07-28 | Disposition: A | Discharge status: Home / Self Care | Payer: 59 | Source: Ambulatory Visit | Attending: Orthopedic Surgery | Admitting: Orthopedic Surgery

## 2016-07-28 DIAGNOSIS — M1612 Unilateral primary osteoarthritis, left hip: Secondary | ICD-10-CM | POA: Insufficient documentation

## 2016-07-28 DIAGNOSIS — Z01812 Encounter for preprocedural laboratory examination: Secondary | ICD-10-CM | POA: Insufficient documentation

## 2016-07-28 LAB — SURGICAL PCR SCREEN
MRSA, PCR: NEGATIVE
STAPHYLOCOCCUS AUREUS: POSITIVE — AB

## 2016-07-28 LAB — CBC
HEMATOCRIT: 42.7 % (ref 36.0–46.0)
Hemoglobin: 14.5 g/dL (ref 12.0–15.0)
MCH: 32.4 pg (ref 26.0–34.0)
MCHC: 34 g/dL (ref 30.0–36.0)
MCV: 95.3 fL (ref 78.0–100.0)
Platelets: 298 10*3/uL (ref 150–400)
RBC: 4.48 MIL/uL (ref 3.87–5.11)
RDW: 12.5 % (ref 11.5–15.5)
WBC: 7 10*3/uL (ref 4.0–10.5)

## 2016-07-28 LAB — COMPREHENSIVE METABOLIC PANEL
ALBUMIN: 4.3 g/dL (ref 3.5–5.0)
ALT: 17 U/L (ref 14–54)
AST: 24 U/L (ref 15–41)
Alkaline Phosphatase: 60 U/L (ref 38–126)
Anion gap: 6 (ref 5–15)
BILIRUBIN TOTAL: 0.8 mg/dL (ref 0.3–1.2)
BUN: 17 mg/dL (ref 6–20)
CHLORIDE: 106 mmol/L (ref 101–111)
CO2: 30 mmol/L (ref 22–32)
CREATININE: 0.86 mg/dL (ref 0.44–1.00)
Calcium: 9.9 mg/dL (ref 8.9–10.3)
GFR calc Af Amer: >60 mL/min (ref >60)
Glucose, Bld: 106 mg/dL — ABNORMAL HIGH (ref 65–99)
Potassium: 4.3 mmol/L (ref 3.5–5.1)
Sodium: 142 mmol/L (ref 135–145)
Total Protein: 7 g/dL (ref 6.5–8.1)

## 2016-07-28 LAB — APTT: APTT: 30 s (ref 24–36)

## 2016-07-28 LAB — PROTIME-INR
INR: 1.01
PROTHROMBIN TIME: 13.3 s (ref 11.4–15.2)

## 2016-07-28 NOTE — Progress Notes (Signed)
07-15-16 Surgical clearance from Dr. Sheryn Bison on chart, as well as unconfirmed EKG (Sinus [name]). Pt brought these documents to her PAT appt.

## 2016-07-29 NOTE — Progress Notes (Addendum)
07-29-16 Left voice message for patient, to advise that she was positive for Staph. Called prescription into pharmacy for Mupirocin. Awaiting return call from patient.  07-29-16 @15 :17, Spoke to patient. Patient aware of need to begin administration of Mupirocin.

## 2016-08-03 ENCOUNTER — Ambulatory Visit: Payer: Self-pay | Admitting: Orthopedic Surgery

## 2016-08-03 NOTE — H&P (Signed)
[name] DOB: 1950-10-29 Married / Language: English / Race: White Female Date of Admission:  08/06/2016 CC:  Left Hip Pain History of Present Illness  The patient is a 66 year old female who comes in for a preoperative History and Physical. The patient is scheduled for a left total hip arthroplasty (anterior) to be performed by Dr. Dione Plover. Aluisio, MD at [facility] on 08/06/2016. The patient is a 66 year old female who presented for follow up of their hip. The patient is being followed for their left hip pain and osteoarthritis. They are months out from intra-articular injection. It did provide some relief. Her hip is getting progressively worse over time. It is starting to limit what she can and cannot do. It is getting harder for her to get up and down and get moving well. The pain is present and problematic, but the function has now worsened the pain. AP pelvis and lateral of the left hip are reviewed. She has bone on bone arthritis in that left hip. She has got advanced end-stage arthritis of left hip, progressively worsening pain and dysfunction. At this point, the most predictable means of improving pain and function is total hip arthroplasty. The procedure, risks, potential complications and rehab course are discussed in detail and the patient elects to proceed. We discussed anterior total hip and differences between the two with respect to approach as well as respect to rehab. She is very much in favor of proceeding as planned. They have been treated conservatively in the past for the above stated problem and despite conservative measures, they continue to have progressive pain and severe functional limitations and dysfunction. They have failed non-operative management including home exercise, medications, and injections. It is felt that they would benefit from undergoing total joint replacement. Risks and benefits of the procedure have been discussed with the patient and they  elect to proceed with surgery. There are no active contraindications to surgery such as ongoing infection or rapidly progressive neurological disease.  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 She IS able to take Hydrocodone. Dilaudid *ANALGESICS - OPIOID*  Nausea. FentaNYL *ANALGESICS - OPIOID*  Nausea.  Family History  Cancer  father Osteoporosis  mother and father Osteoarthritis  First Degree Relatives. mother and father Cerebrovascular Accident  mother Hypertension  mother Severe allergy  father  Social History  Number of flights of stairs before winded  2-3 Illicit drug use  no Living situation  live with spouse Tobacco / smoke exposure  no Alcohol use  current drinker; drinks beer and wine; 5-7 per week Current work status  working full time Children  0 Pain Contract  no Drug/Alcohol Rehab (Currently)  no Marital status  married Drug/Alcohol Rehab (Previously)  no Tobacco use  Never smoker. never smoker Exercise  Exercises daily; does running / walking, individual sport and other  Medication History  Tretinoin skin cream Active. Zolpidem Tartrate (10MG  Tablet, Oral as needed) Active. Ibuprofen (200MG  Capsule, 1 (one) Oral) Active. Voltaren (1% Gel, Transdermal) Active. Cyclobenzaprine HCl (10MG  Tablet, 1/2 tab Oral as needed) Active.  Past Surgical History  Hysterectomy  complete (non-cancerous) Total Hip Replacement  right Arthroscopy of Knee  left  Review of Systems  General Not Present- Chills, Fatigue, Fever, Memory Loss, Night Sweats, Weight Gain and Weight Loss. Skin Not Present- Eczema, Hives, Itching,  Lesions and Rash. HEENT Not Present- Dentures, Double Vision, Headache, Hearing Loss, Tinnitus and Visual Loss. Respiratory Not Present- Allergies, Chronic Cough, Coughing up blood, Shortness of breath at rest and Shortness of breath with exertion. Cardiovascular Not Present- Chest Pain, Difficulty Breathing Lying Down, Murmur, Palpitations, Racing/skipping heartbeats and Swelling. Gastrointestinal Not Present- Abdominal Pain, Bloody Stool, Constipation, Diarrhea, Difficulty Swallowing, Heartburn, Jaundice, Loss of appetitie, Nausea and Vomiting. Female Genitourinary Not Present- Blood in Urine, Discharge, Flank Pain, Incontinence, Painful Urination, Urgency, Urinary frequency, Urinary Retention, Urinating at Night and Weak urinary stream. Musculoskeletal Not Present- Back Pain, Joint Pain, Joint Swelling, Morning Stiffness, Muscle Pain, Muscle Weakness and Spasms. Neurological Not Present- Blackout spells, Difficulty with balance, Dizziness, Paralysis, Tremor and Weakness. Psychiatric Not Present- Insomnia.  Vitals  Weight: 135 lb Height: 64in Body Surface Area: 1.66 m Body Mass Index: 23.17 kg/m  Pulse: 64 (Regular)  BP: 122/74 (Sitting, Right Arm, Standard)     Physical Exam General Mental Status -Alert, cooperative and good historian. General Appearance-pleasant, Not in acute distress. Orientation-Oriented X3. Build & Nutrition-Well nourished and Well developed.  Head and Neck Head-normocephalic, atraumatic . Neck Global Assessment - supple, no bruit auscultated on the right, no bruit auscultated on the left.  Eye Pupil - Bilateral-Regular and Round. Motion - Bilateral-EOMI.  Chest and Lung Exam Auscultation Breath sounds - clear at anterior chest wall and clear at posterior chest wall. Adventitious sounds - No Adventitious sounds.  Cardiovascular Auscultation Rhythm - Regular rate and rhythm. Heart Sounds - S1 WNL and S2 WNL. Murmurs & Other  Heart Sounds - Auscultation of the heart reveals - No Murmurs.  Abdomen Palpation/Percussion Tenderness - Abdomen is non-tender to palpation. Rigidity (guarding) - Abdomen is soft. Auscultation Auscultation of the abdomen reveals - Bowel sounds normal.  Female Genitourinary Note: Not done, not pertinent to present illness   Musculoskeletal Note: On exam, she is in no distress. Her left hip can be flexed to about 100, minimal internal rotation, about 20 external rotation, 20 abduction. Right hip has excellent motion with no pain.  RADIOGRAPHS AP pelvis and lateral of the left hip are reviewed. She has bone on bone arthritis in that left hip.   Assessment & Plan Primary osteoarthritis of left hip (M16.12)  Status post right hip replacement (M60.045)  Note:Surgical Plans: Left Total Hip Replacement - Anterior Approach  Disposition: Home with help, HEP - Exercise Sheet Provided  PCP: Dr. Sheryn Bison - Patient has been seen preoperatively and felt to be stable for surgery.  IV TXA  Anesthesia Issues: None  Patient was instructed on what medications to stop prior to surgery.  Signed electronically by Joelene Millin, III PA-C

## 2016-08-06 ENCOUNTER — Encounter (INDEPENDENT_AMBULATORY_CARE_PROVIDER_SITE_OTHER): Payer: Self-pay

## 2016-08-06 ENCOUNTER — Inpatient Hospital Stay (HOSPITAL_COMMUNITY): Payer: 59

## 2016-08-06 ENCOUNTER — Inpatient Hospital Stay (HOSPITAL_COMMUNITY): Payer: 59 | Admitting: Anesthesiology

## 2016-08-06 ENCOUNTER — Encounter (HOSPITAL_COMMUNITY)
Admission: RE | Disposition: A | Discharge status: Home / Self Care | Payer: Self-pay | Source: Ambulatory Visit | Attending: Orthopedic Surgery

## 2016-08-06 ENCOUNTER — Encounter (HOSPITAL_COMMUNITY): Payer: Self-pay

## 2016-08-06 ENCOUNTER — Inpatient Hospital Stay (HOSPITAL_COMMUNITY)
Admission: RE | Admit: 2016-08-06 | Discharge: 2016-08-07 | DRG: 470 | Disposition: A | Discharge status: Home / Self Care | Payer: 59 | Source: Ambulatory Visit | Attending: Orthopedic Surgery | Admitting: Orthopedic Surgery

## 2016-08-06 DIAGNOSIS — Z90710 Acquired absence of both cervix and uterus: Secondary | ICD-10-CM

## 2016-08-06 DIAGNOSIS — M169 Osteoarthritis of hip, unspecified: Secondary | ICD-10-CM | POA: Diagnosis present

## 2016-08-06 DIAGNOSIS — Z885 Allergy status to narcotic agent status: Secondary | ICD-10-CM | POA: Diagnosis not present

## 2016-08-06 DIAGNOSIS — Z85828 Personal history of other malignant neoplasm of skin: Secondary | ICD-10-CM

## 2016-08-06 DIAGNOSIS — H9319 Tinnitus, unspecified ear: Secondary | ICD-10-CM | POA: Diagnosis present

## 2016-08-06 DIAGNOSIS — Z823 Family history of stroke: Secondary | ICD-10-CM | POA: Diagnosis not present

## 2016-08-06 DIAGNOSIS — M1612 Unilateral primary osteoarthritis, left hip: Secondary | ICD-10-CM | POA: Diagnosis present

## 2016-08-06 DIAGNOSIS — Z96641 Presence of right artificial hip joint: Secondary | ICD-10-CM | POA: Diagnosis present

## 2016-08-06 DIAGNOSIS — M545 Low back pain: Secondary | ICD-10-CM | POA: Diagnosis present

## 2016-08-06 DIAGNOSIS — Z8262 Family history of osteoporosis: Secondary | ICD-10-CM

## 2016-08-06 DIAGNOSIS — Z881 Allergy status to other antibiotic agents status: Secondary | ICD-10-CM

## 2016-08-06 DIAGNOSIS — Z96649 Presence of unspecified artificial hip joint: Secondary | ICD-10-CM

## 2016-08-06 HISTORY — PX: TOTAL HIP ARTHROPLASTY: SHX124

## 2016-08-06 LAB — TYPE AND SCREEN
ABO/RH(D): A POS
ANTIBODY SCREEN: NEGATIVE

## 2016-08-06 SURGERY — ARTHROPLASTY, HIP, TOTAL, ANTERIOR APPROACH
Anesthesia: Spinal | Site: Hip | Laterality: Left

## 2016-08-06 MED ORDER — ACETAMINOPHEN 325 MG PO TABS: 650.0000 mg | ORAL_TABLET | Freq: Four times a day (QID) | ORAL | Status: DC | PRN

## 2016-08-06 MED ORDER — KETAMINE HCL 10 MG/ML IJ SOLN
INTRAMUSCULAR | Status: AC
  Filled 2016-08-06: qty 1

## 2016-08-06 MED ORDER — POLYETHYLENE GLYCOL 3350 17 G PO PACK
17.0000 g | PACK | Freq: Every day | ORAL | Status: DC | PRN
  Administered 2016-08-06: 17 g via ORAL
  Filled 2016-08-06: qty 1

## 2016-08-06 MED ORDER — CHLORHEXIDINE GLUCONATE 4 % EX LIQD: 60.0000 mL | Freq: Once | CUTANEOUS | Status: DC

## 2016-08-06 MED ORDER — PHENOL 1.4 % MT LIQD: 1.0000 | OROMUCOSAL | Status: DC | PRN

## 2016-08-06 MED ORDER — HYDROCODONE-ACETAMINOPHEN 5-325 MG PO TABS
1.0000 | ORAL_TABLET | ORAL | Status: DC | PRN
  Administered 2016-08-06: 1 via ORAL
  Administered 2016-08-06: 2 via ORAL
  Administered 2016-08-06: 1 via ORAL
  Administered 2016-08-06 – 2016-08-07 (×4): 2 via ORAL
  Filled 2016-08-06: qty 2
  Filled 2016-08-06: qty 1
  Filled 2016-08-06 (×4): qty 2
  Filled 2016-08-06: qty 1

## 2016-08-06 MED ORDER — ACETAMINOPHEN 650 MG RE SUPP: 650.0000 mg | Freq: Four times a day (QID) | RECTAL | Status: DC | PRN

## 2016-08-06 MED ORDER — BUPIVACAINE IN DEXTROSE 0.75-8.25 % IT SOLN
INTRATHECAL | Status: DC | PRN
  Administered 2016-08-06: 2 mL via INTRATHECAL

## 2016-08-06 MED ORDER — HYDROMORPHONE HCL-NACL 0.5-0.9 MG/ML-% IV SOSY
0.2500 mg | PREFILLED_SYRINGE | INTRAVENOUS | Status: DC | PRN
  Administered 2016-08-06: 0.25 mg via INTRAVENOUS

## 2016-08-06 MED ORDER — PHENYLEPHRINE HCL 10 MG/ML IJ SOLN
INTRAVENOUS | Status: DC | PRN
  Administered 2016-08-06: 10 ug/min via INTRAVENOUS

## 2016-08-06 MED ORDER — BUPIVACAINE HCL (PF) 0.25 % IJ SOLN
INTRAMUSCULAR | Status: AC
  Filled 2016-08-06: qty 30

## 2016-08-06 MED ORDER — MIDAZOLAM HCL 5 MG/5ML IJ SOLN
INTRAMUSCULAR | Status: DC | PRN
  Administered 2016-08-06: 2 mg via INTRAVENOUS

## 2016-08-06 MED ORDER — LACTATED RINGERS IV SOLN
INTRAVENOUS | Status: DC
  Administered 2016-08-06: 09:00:00 via INTRAVENOUS
  Administered 2016-08-06: 1000 mL via INTRAVENOUS

## 2016-08-06 MED ORDER — METHOCARBAMOL 500 MG PO TABS
500.0000 mg | ORAL_TABLET | Freq: Four times a day (QID) | ORAL | Status: DC | PRN
  Administered 2016-08-06 – 2016-08-07 (×3): 500 mg via ORAL
  Filled 2016-08-06 (×3): qty 1

## 2016-08-06 MED ORDER — PHENYLEPHRINE HCL 10 MG/ML IJ SOLN
INTRAMUSCULAR | Status: DC | PRN
  Administered 2016-08-06: 80 ug via INTRAVENOUS
  Administered 2016-08-06: 40 ug via INTRAVENOUS

## 2016-08-06 MED ORDER — DEXAMETHASONE SODIUM PHOSPHATE 10 MG/ML IJ SOLN
10.0000 mg | Freq: Once | INTRAMUSCULAR | Status: AC
  Administered 2016-08-07: 10 mg via INTRAVENOUS
  Filled 2016-08-06: qty 1

## 2016-08-06 MED ORDER — MIDAZOLAM HCL 2 MG/2ML IJ SOLN: 0.5000 mg | Freq: Once | INTRAMUSCULAR | Status: DC | PRN

## 2016-08-06 MED ORDER — MENTHOL 3 MG MT LOZG: 1.0000 | LOZENGE | OROMUCOSAL | Status: DC | PRN

## 2016-08-06 MED ORDER — BUPIVACAINE HCL (PF) 0.25 % IJ SOLN
INTRAMUSCULAR | Status: DC | PRN
  Administered 2016-08-06: 30 mL

## 2016-08-06 MED ORDER — ONDANSETRON HCL 4 MG/2ML IJ SOLN: 4.0000 mg | Freq: Four times a day (QID) | INTRAMUSCULAR | Status: DC | PRN

## 2016-08-06 MED ORDER — CEFAZOLIN SODIUM-DEXTROSE 2-4 GM/100ML-% IV SOLN
2.0000 g | INTRAVENOUS | Status: AC
  Administered 2016-08-06: 2 g via INTRAVENOUS

## 2016-08-06 MED ORDER — TRANEXAMIC ACID 1000 MG/10ML IV SOLN
1000.0000 mg | INTRAVENOUS | Status: AC
  Administered 2016-08-06: 1000 mg via INTRAVENOUS
  Filled 2016-08-06: qty 10

## 2016-08-06 MED ORDER — ACETAMINOPHEN 10 MG/ML IV SOLN
1000.0000 mg | Freq: Once | INTRAVENOUS | Status: AC
  Administered 2016-08-06: 1000 mg via INTRAVENOUS

## 2016-08-06 MED ORDER — CEFAZOLIN SODIUM-DEXTROSE 2-4 GM/100ML-% IV SOLN
2.0000 g | Freq: Four times a day (QID) | INTRAVENOUS | Status: AC
  Administered 2016-08-06 (×2): 2 g via INTRAVENOUS
  Filled 2016-08-06 (×2): qty 100

## 2016-08-06 MED ORDER — SODIUM CHLORIDE 0.9 % IV SOLN
INTRAVENOUS | Status: DC
  Administered 2016-08-06: 75 mL/h via INTRAVENOUS
  Administered 2016-08-07: 02:00:00 via INTRAVENOUS

## 2016-08-06 MED ORDER — CYCLOBENZAPRINE HCL 5 MG PO TABS: 5.0000 mg | ORAL_TABLET | Freq: Three times a day (TID) | ORAL | Status: DC | PRN

## 2016-08-06 MED ORDER — ONDANSETRON HCL 4 MG/2ML IJ SOLN
INTRAMUSCULAR | Status: DC | PRN
  Administered 2016-08-06: 4 mg via INTRAVENOUS

## 2016-08-06 MED ORDER — METOCLOPRAMIDE HCL 5 MG PO TABS: 5.0000 mg | ORAL_TABLET | Freq: Three times a day (TID) | ORAL | Status: DC | PRN

## 2016-08-06 MED ORDER — TRIAMCINOLONE ACETONIDE 55 MCG/ACT NA AERO
2.0000 | INHALATION_SPRAY | Freq: Every day | NASAL | Status: DC
  Filled 2016-08-06: qty 21.6

## 2016-08-06 MED ORDER — 0.9 % SODIUM CHLORIDE (POUR BTL) OPTIME
TOPICAL | Status: DC | PRN
  Administered 2016-08-06: 1000 mL

## 2016-08-06 MED ORDER — LORATADINE 10 MG PO TABS
10.0000 mg | ORAL_TABLET | Freq: Every day | ORAL | Status: DC
  Filled 2016-08-06: qty 1

## 2016-08-06 MED ORDER — RIVAROXABAN 10 MG PO TABS
10.0000 mg | ORAL_TABLET | Freq: Every day | ORAL | Status: DC
  Administered 2016-08-07: 10 mg via ORAL
  Filled 2016-08-06: qty 1

## 2016-08-06 MED ORDER — PROPOFOL 500 MG/50ML IV EMUL
INTRAVENOUS | Status: DC | PRN
  Administered 2016-08-06: 20 mg via INTRAVENOUS

## 2016-08-06 MED ORDER — METHOCARBAMOL 1000 MG/10ML IJ SOLN
500.0000 mg | Freq: Four times a day (QID) | INTRAVENOUS | Status: DC | PRN
  Administered 2016-08-06: 500 mg via INTRAVENOUS
  Filled 2016-08-06: qty 5

## 2016-08-06 MED ORDER — FLEET ENEMA 7-19 GM/118ML RE ENEM: 1.0000 | ENEMA | Freq: Once | RECTAL | Status: DC | PRN

## 2016-08-06 MED ORDER — HYDROMORPHONE HCL-NACL 0.5-0.9 MG/ML-% IV SOSY
PREFILLED_SYRINGE | INTRAVENOUS | Status: AC
  Filled 2016-08-06: qty 1

## 2016-08-06 MED ORDER — BISACODYL 10 MG RE SUPP: 10.0000 mg | Freq: Every day | RECTAL | Status: DC | PRN

## 2016-08-06 MED ORDER — ONDANSETRON HCL 4 MG PO TABS: 4.0000 mg | ORAL_TABLET | Freq: Four times a day (QID) | ORAL | Status: DC | PRN

## 2016-08-06 MED ORDER — MIDAZOLAM HCL 2 MG/2ML IJ SOLN
INTRAMUSCULAR | Status: AC
  Filled 2016-08-06: qty 2

## 2016-08-06 MED ORDER — DIPHENHYDRAMINE HCL 12.5 MG/5ML PO ELIX: 12.5000 mg | ORAL_SOLUTION | ORAL | Status: DC | PRN

## 2016-08-06 MED ORDER — METOCLOPRAMIDE HCL 5 MG/ML IJ SOLN: 5.0000 mg | Freq: Three times a day (TID) | INTRAMUSCULAR | Status: DC | PRN

## 2016-08-06 MED ORDER — SCOPOLAMINE 1 MG/3DAYS TD PT72
MEDICATED_PATCH | TRANSDERMAL | Status: AC
  Filled 2016-08-06: qty 1

## 2016-08-06 MED ORDER — MONTELUKAST SODIUM 10 MG PO TABS
10.0000 mg | ORAL_TABLET | Freq: Every day | ORAL | Status: DC
  Administered 2016-08-06: 10 mg via ORAL
  Filled 2016-08-06: qty 1

## 2016-08-06 MED ORDER — MEPERIDINE HCL 50 MG/ML IJ SOLN: 6.2500 mg | INTRAMUSCULAR | Status: DC | PRN

## 2016-08-06 MED ORDER — MORPHINE SULFATE (PF) 4 MG/ML IV SOLN: 1.0000 mg | INTRAVENOUS | Status: DC | PRN

## 2016-08-06 MED ORDER — TRAMADOL HCL 50 MG PO TABS: 50.0000 mg | ORAL_TABLET | Freq: Four times a day (QID) | ORAL | Status: DC | PRN

## 2016-08-06 MED ORDER — SCOPOLAMINE 1 MG/3DAYS TD PT72
MEDICATED_PATCH | TRANSDERMAL | Status: DC | PRN
  Administered 2016-08-06: 1 via TRANSDERMAL

## 2016-08-06 MED ORDER — DEXAMETHASONE SODIUM PHOSPHATE 10 MG/ML IJ SOLN
10.0000 mg | Freq: Once | INTRAMUSCULAR | Status: AC
  Administered 2016-08-06: 10 mg via INTRAVENOUS

## 2016-08-06 MED ORDER — PROPOFOL 500 MG/50ML IV EMUL
INTRAVENOUS | Status: DC | PRN
  Administered 2016-08-06: 75 ug/kg/min via INTRAVENOUS

## 2016-08-06 MED ORDER — PROMETHAZINE HCL 25 MG/ML IJ SOLN: 6.2500 mg | INTRAMUSCULAR | Status: DC | PRN

## 2016-08-06 MED ORDER — PROPOFOL 10 MG/ML IV BOLUS
INTRAVENOUS | Status: AC
  Filled 2016-08-06: qty 40

## 2016-08-06 MED ORDER — ONDANSETRON HCL 4 MG/2ML IJ SOLN
INTRAMUSCULAR | Status: AC
  Filled 2016-08-06: qty 2

## 2016-08-06 MED ORDER — TRIAMCINOLONE ACETONIDE 55 MCG/ACT NA AERO
2.0000 | INHALATION_SPRAY | Freq: Every day | NASAL | Status: DC
  Administered 2016-08-06: 2 via NASAL
  Filled 2016-08-06: qty 10.8

## 2016-08-06 MED ORDER — ZOLPIDEM TARTRATE 5 MG PO TABS: 5.0000 mg | ORAL_TABLET | Freq: Every evening | ORAL | Status: DC | PRN

## 2016-08-06 MED ORDER — ACETAMINOPHEN 10 MG/ML IV SOLN
INTRAVENOUS | Status: AC
Start: 2016-08-06 — End: 2016-08-06
  Filled 2016-08-06: qty 100

## 2016-08-06 MED ORDER — STERILE WATER FOR IRRIGATION IR SOLN
Status: DC | PRN
  Administered 2016-08-06: 2000 mL

## 2016-08-06 MED ORDER — DOCUSATE SODIUM 100 MG PO CAPS
100.0000 mg | ORAL_CAPSULE | Freq: Two times a day (BID) | ORAL | Status: DC
  Administered 2016-08-06 – 2016-08-07 (×2): 100 mg via ORAL
  Filled 2016-08-06 (×2): qty 1

## 2016-08-06 MED ORDER — CEFAZOLIN SODIUM-DEXTROSE 2-4 GM/100ML-% IV SOLN
INTRAVENOUS | Status: AC
  Filled 2016-08-06: qty 100

## 2016-08-06 SURGICAL SUPPLY — 40 items
BAG DECANTER FOR FLEXI CONT (MISCELLANEOUS) ×2 IMPLANT
BAG SPEC THK2 15X12 ZIP CLS (MISCELLANEOUS) ×1
BAG ZIPLOCK 12X15 (MISCELLANEOUS) ×1 IMPLANT
BLADE SAG 18X100X1.27 (BLADE) ×2 IMPLANT
CAPT HIP TOTAL 2 ×1 IMPLANT
CLOTH BEACON ORANGE TIMEOUT ST (SAFETY) ×2 IMPLANT
COVER PERINEAL POST (MISCELLANEOUS) ×2 IMPLANT
COVER SURGICAL LIGHT HANDLE (MISCELLANEOUS) ×2 IMPLANT
DECANTER SPIKE VIAL GLASS SM (MISCELLANEOUS) ×2 IMPLANT
DRAPE STERI IOBAN 125X83 (DRAPES) ×2 IMPLANT
DRAPE U-SHAPE 47X51 STRL (DRAPES) ×4 IMPLANT
DRSG ADAPTIC 3X8 NADH LF (GAUZE/BANDAGES/DRESSINGS) ×2 IMPLANT
DRSG MEPILEX BORDER 4X4 (GAUZE/BANDAGES/DRESSINGS) ×2 IMPLANT
DRSG MEPILEX BORDER 4X8 (GAUZE/BANDAGES/DRESSINGS) ×2 IMPLANT
DURAPREP 26ML APPLICATOR (WOUND CARE) ×2 IMPLANT
ELECT REM PT RETURN 15FT ADLT (MISCELLANEOUS) ×2 IMPLANT
EVACUATOR 1/8 PVC DRAIN (DRAIN) ×2 IMPLANT
GLOVE BIO SURGEON STRL SZ7.5 (GLOVE) ×2 IMPLANT
GLOVE BIO SURGEON STRL SZ8 (GLOVE) ×4 IMPLANT
GLOVE BIOGEL PI IND STRL 6.5 (GLOVE) IMPLANT
GLOVE BIOGEL PI IND STRL 7.5 (GLOVE) IMPLANT
GLOVE BIOGEL PI IND STRL 8 (GLOVE) ×2 IMPLANT
GLOVE BIOGEL PI INDICATOR 6.5 (GLOVE) ×2
GLOVE BIOGEL PI INDICATOR 7.5 (GLOVE) ×1
GLOVE BIOGEL PI INDICATOR 8 (GLOVE) ×2
GLOVE ECLIPSE 6.5 STRL STRAW (GLOVE) ×1 IMPLANT
GLOVE SURG SS PI 7.0 STRL IVOR (GLOVE) ×1 IMPLANT
GOWN STRL REUS W/TWL LRG LVL3 (GOWN DISPOSABLE) ×4 IMPLANT
GOWN STRL REUS W/TWL XL LVL3 (GOWN DISPOSABLE) ×4 IMPLANT
PACK ANTERIOR HIP CUSTOM (KITS) ×2 IMPLANT
STRIP CLOSURE SKIN 1/2X4 (GAUZE/BANDAGES/DRESSINGS) ×3 IMPLANT
SUT ETHIBOND NAB CT1 #1 30IN (SUTURE) ×2 IMPLANT
SUT MNCRL AB 4-0 PS2 18 (SUTURE) ×2 IMPLANT
SUT STRATAFIX 0 PDS 27 VIOLET (SUTURE) ×2
SUT VIC AB 2-0 CT1 27 (SUTURE) ×4
SUT VIC AB 2-0 CT1 TAPERPNT 27 (SUTURE) ×2 IMPLANT
SUTURE STRATFX 0 PDS 27 VIOLET (SUTURE) ×1 IMPLANT
TRAY FOLEY CATH SILVER 14FR (SET/KITS/TRAYS/PACK) ×1 IMPLANT
TRAY FOLEY W/METER SILVER 16FR (SET/KITS/TRAYS/PACK) ×1 IMPLANT
YANKAUER SUCT BULB TIP 10FT TU (MISCELLANEOUS) ×2 IMPLANT

## 2016-08-06 NOTE — H&P (View-Only) (Signed)
[name] DOB: 01-05-1951 Married / Language: English / Race: White Female Date of Admission:  08/06/2016 CC:  Left Hip Pain History of Present Illness  The patient is a 66 year old female who comes in for a preoperative History and Physical. The patient is scheduled for a left total hip arthroplasty (anterior) to be performed by Dr. Dione Plover. Aluisio, MD at [facility] on 08/06/2016. The patient is a 66 year old female who presented for follow up of their hip. The patient is being followed for their left hip pain and osteoarthritis. They are months out from intra-articular injection. It did provide some relief. Her hip is getting progressively worse over time. It is starting to limit what she can and cannot do. It is getting harder for her to get up and down and get moving well. The pain is present and problematic, but the function has now worsened the pain. AP pelvis and lateral of the left hip are reviewed. She has bone on bone arthritis in that left hip. She has got advanced end-stage arthritis of left hip, progressively worsening pain and dysfunction. At this point, the most predictable means of improving pain and function is total hip arthroplasty. The procedure, risks, potential complications and rehab course are discussed in detail and the patient elects to proceed. We discussed anterior total hip and differences between the two with respect to approach as well as respect to rehab. She is very much in favor of proceeding as planned. They have been treated conservatively in the past for the above stated problem and despite conservative measures, they continue to have progressive pain and severe functional limitations and dysfunction. They have failed non-operative management including home exercise, medications, and injections. It is felt that they would benefit from undergoing total joint replacement. Risks and benefits of the procedure have been discussed with the patient and they  elect to proceed with surgery. There are no active contraindications to surgery such as ongoing infection or rapidly progressive neurological disease.  Problem List/Past Medical  Pain of left hip joint (M25.552)  Lumbar pain (724.2)  Status post right hip replacement (B09.628)  Primary osteoarthritis of left hip (M16.12)  Osteoarthritis  Tinnitus  Skin Cancer   Allergies  Minocycline HCl *TETRACYCLINES*  Erythromycin *MACROLIDES*  liver reaction years ago OxyCODONE HCl *ANALGESICS - OPIOID*  Nausea. She IS able to take Hydrocodone. Dilaudid *ANALGESICS - OPIOID*  Nausea. FentaNYL *ANALGESICS - OPIOID*  Nausea.  Family History  Cancer  father Osteoporosis  mother and father Osteoarthritis  First Degree Relatives. mother and father Cerebrovascular Accident  mother Hypertension  mother Severe allergy  father  Social History  Number of flights of stairs before winded  2-3 Illicit drug use  no Living situation  live with spouse Tobacco / smoke exposure  no Alcohol use  current drinker; drinks beer and wine; 5-7 per week Current work status  working full time Children  0 Pain Contract  no Drug/Alcohol Rehab (Currently)  no Marital status  married Drug/Alcohol Rehab (Previously)  no Tobacco use  Never smoker. never smoker Exercise  Exercises daily; does running / walking, individual sport and other  Medication History  Tretinoin skin cream Active. Zolpidem Tartrate (10MG  Tablet, Oral as needed) Active. Ibuprofen (200MG  Capsule, 1 (one) Oral) Active. Voltaren (1% Gel, Transdermal) Active. Cyclobenzaprine HCl (10MG  Tablet, 1/2 tab Oral as needed) Active.  Past Surgical History  Hysterectomy  complete (non-cancerous) Total Hip Replacement  right Arthroscopy of Knee  left  Review of Systems  General Not Present- Chills, Fatigue, Fever, Memory Loss, Night Sweats, Weight Gain and Weight Loss. Skin Not Present- Eczema, Hives, Itching,  Lesions and Rash. HEENT Not Present- Dentures, Double Vision, Headache, Hearing Loss, Tinnitus and Visual Loss. Respiratory Not Present- Allergies, Chronic Cough, Coughing up blood, Shortness of breath at rest and Shortness of breath with exertion. Cardiovascular Not Present- Chest Pain, Difficulty Breathing Lying Down, Murmur, Palpitations, Racing/skipping heartbeats and Swelling. Gastrointestinal Not Present- Abdominal Pain, Bloody Stool, Constipation, Diarrhea, Difficulty Swallowing, Heartburn, Jaundice, Loss of appetitie, Nausea and Vomiting. Female Genitourinary Not Present- Blood in Urine, Discharge, Flank Pain, Incontinence, Painful Urination, Urgency, Urinary frequency, Urinary Retention, Urinating at Night and Weak urinary stream. Musculoskeletal Not Present- Back Pain, Joint Pain, Joint Swelling, Morning Stiffness, Muscle Pain, Muscle Weakness and Spasms. Neurological Not Present- Blackout spells, Difficulty with balance, Dizziness, Paralysis, Tremor and Weakness. Psychiatric Not Present- Insomnia.  Vitals  Weight: 135 lb Height: 64in Body Surface Area: 1.66 m Body Mass Index: 23.17 kg/m  Pulse: 64 (Regular)  BP: 122/74 (Sitting, Right Arm, Standard)     Physical Exam General Mental Status -Alert, cooperative and good historian. General Appearance-pleasant, Not in acute distress. Orientation-Oriented X3. Build & Nutrition-Well nourished and Well developed.  Head and Neck Head-normocephalic, atraumatic . Neck Global Assessment - supple, no bruit auscultated on the right, no bruit auscultated on the left.  Eye Pupil - Bilateral-Regular and Round. Motion - Bilateral-EOMI.  Chest and Lung Exam Auscultation Breath sounds - clear at anterior chest wall and clear at posterior chest wall. Adventitious sounds - No Adventitious sounds.  Cardiovascular Auscultation Rhythm - Regular rate and rhythm. Heart Sounds - S1 WNL and S2 WNL. Murmurs & Other  Heart Sounds - Auscultation of the heart reveals - No Murmurs.  Abdomen Palpation/Percussion Tenderness - Abdomen is non-tender to palpation. Rigidity (guarding) - Abdomen is soft. Auscultation Auscultation of the abdomen reveals - Bowel sounds normal.  Female Genitourinary Note: Not done, not pertinent to present illness   Musculoskeletal Note: On exam, she is in no distress. Her left hip can be flexed to about 100, minimal internal rotation, about 20 external rotation, 20 abduction. Right hip has excellent motion with no pain.  RADIOGRAPHS AP pelvis and lateral of the left hip are reviewed. She has bone on bone arthritis in that left hip.   Assessment & Plan Primary osteoarthritis of left hip (M16.12)  Status post right hip replacement (O70.962)  Note:Surgical Plans: Left Total Hip Replacement - Anterior Approach  Disposition: Home with help, HEP - Exercise Sheet Provided  PCP: Dr. Sheryn Bison - Patient has been seen preoperatively and felt to be stable for surgery.  IV TXA  Anesthesia Issues: None  Patient was instructed on what medications to stop prior to surgery.  Signed electronically by Joelene Millin, III PA-C

## 2016-08-06 NOTE — Interval H&P Note (Signed)
History and Physical Interval Note:  08/06/2016 7:06 AM  [name]  has presented today for surgery, with the diagnosis of Osteoarthritis Left Hip  The various methods of treatment have been discussed with the patient and family. After consideration of risks, benefits and other options for treatment, the patient has consented to  Procedure(s): LEFT TOTAL HIP ARTHROPLASTY ANTERIOR APPROACH (Left) as a surgical intervention .  The patient's history has been reviewed, patient examined, no change in status, stable for surgery.  I have reviewed the patient's chart and labs.  Questions were answered to the patient's satisfaction.     [name]

## 2016-08-06 NOTE — Anesthesia Preprocedure Evaluation (Addendum)
Anesthesia Evaluation  Patient identified by MRN, date of birth, ID band Patient awake    Reviewed: Allergy & Precautions, NPO status , Patient's Chart, lab work & pertinent test results  History of Anesthesia Complications Negative for: history of anesthetic complications  Airway Mallampati: II  TM Distance: >3 FB Neck ROM: Full    Dental  (+) Caps, Dental Advisory Given   Pulmonary neg pulmonary ROS,    breath sounds clear to auscultation       Cardiovascular (-) anginanegative cardio ROS   Rhythm:Regular Rate:Normal     Neuro/Psych negative neurological ROS     GI/Hepatic negative GI ROS, Neg liver ROS,   Endo/Other  negative endocrine ROS  Renal/GU negative Renal ROS     Musculoskeletal  (+) Arthritis ,   Abdominal   Peds  Hematology negative hematology ROS (+)   Anesthesia Other Findings   Reproductive/Obstetrics                             Anesthesia Physical Anesthesia Plan  ASA: II  Anesthesia Plan: Spinal   Post-op Pain Management:    Induction:   PONV Risk Score and Plan: 2 and Ondansetron, Dexamethasone, Scopolamine patch - Pre-op and Propofol  Airway Management Planned: Natural Airway and Nasal Cannula  Additional Equipment:   Intra-op Plan:   Post-operative Plan:   Informed Consent: I have reviewed the patients History and Physical, chart, labs and discussed the procedure including the risks, benefits and alternatives for the proposed anesthesia with the patient or authorized representative who has indicated his/her understanding and acceptance.   Dental advisory given  Plan Discussed with: CRNA and Surgeon  Anesthesia Plan Comments: (Plan routine monitors, SAB)        Anesthesia Quick Evaluation

## 2016-08-06 NOTE — Discharge Instructions (Addendum)
° °Dr. Frank Aluisio °Total Joint Specialist °[facility] °[address] °[place], [place] [address] °[phone] ° °ANTERIOR APPROACH TOTAL HIP REPLACEMENT POSTOPERATIVE DIRECTIONS ° ° °Hip Rehabilitation, Guidelines Following Surgery  °The results of a hip operation are greatly improved after range of motion and muscle strengthening exercises. Follow all safety measures which are given to protect your hip. If any of these exercises cause increased pain or swelling in your joint, decrease the amount until you are comfortable again. Then slowly increase the exercises. Call your caregiver if you have problems or questions.  ° °HOME CARE INSTRUCTIONS  °Remove items at home which could result in a fall. This includes throw rugs or furniture in walking pathways.  °· ICE to the affected hip every three hours for 30 minutes at a time and then as needed for pain and swelling.  Continue to use ice on the hip for pain and swelling from surgery. You may notice swelling that will progress down to the foot and ankle.  This is normal after surgery.  Elevate the leg when you are not up walking on it.   °· Continue to use the breathing machine which will help keep your temperature down.  It is common for your temperature to cycle up and down following surgery, especially at night when you are not up moving around and exerting yourself.  The breathing machine keeps your lungs expanded and your temperature down. ° ° °DIET °You may resume your previous home diet once your are discharged from the hospital. ° °DRESSING / WOUND CARE / SHOWERING °You may shower 3 days after surgery, but keep the wounds dry during showering.  You may use an occlusive plastic wrap (Press'n Seal for example), NO SOAKING/SUBMERGING IN THE BATHTUB.  If the bandage gets wet, change with a clean dry gauze.  If the incision gets wet, pat the wound dry with a clean towel. °You may start showering once you are discharged home but do not  submerge the incision under water. Just pat the incision dry and apply a dry gauze dressing on daily. °Change the surgical dressing daily and reapply a dry dressing each time. ° °ACTIVITY °Walk with your walker as instructed. °Use walker as long as suggested by your caregivers. °Avoid periods of inactivity such as sitting longer than an hour when not asleep. This helps prevent blood clots.  °You may resume a sexual relationship in one month or when given the OK by your doctor.  °You may return to work once you are cleared by your doctor.  °Do not drive a car for 6 weeks or until released by you surgeon.  °Do not drive while taking narcotics. ° °WEIGHT BEARING °Weight bearing as tolerated with assist device (walker, cane, etc) as directed, use it as long as suggested by your surgeon or therapist, typically at least 4-6 weeks. ° °POSTOPERATIVE CONSTIPATION PROTOCOL °Constipation - defined medically as fewer than three stools per week and severe constipation as less than one stool per week. ° °One of the most common issues patients have following surgery is constipation.  Even if you have a regular bowel pattern at home, your normal regimen is likely to be disrupted due to multiple reasons following surgery.  Combination of anesthesia, postoperative narcotics, change in appetite and fluid intake all can affect your bowels.  In order to avoid complications following surgery, here are some recommendations in order to help you during your recovery period. ° °Colace (docusate) - Pick up an over-the-counter   form of Colace or another stool softener and take twice a day as long as you are requiring postoperative pain medications.  Take with a full glass of water daily.  If you experience loose stools or diarrhea, hold the colace until you stool forms back up.  If your symptoms do not get better within 1 week or if they get worse, check with your doctor. ° °Dulcolax (bisacodyl) - Pick up over-the-counter and take as directed  by the product packaging as needed to assist with the movement of your bowels.  Take with a full glass of water.  Use this product as needed if not relieved by Colace only.  ° °MiraLax (polyethylene glycol) - Pick up over-the-counter to have on hand.  MiraLax is a solution that will increase the amount of water in your bowels to assist with bowel movements.  Take as directed and can mix with a glass of water, juice, soda, coffee, or tea.  Take if you go more than two days without a movement. °Do not use MiraLax more than once per day. Call your doctor if you are still constipated or irregular after using this medication for 7 days in a row. ° °If you continue to have problems with postoperative constipation, please contact the office for further assistance and recommendations.  If you experience "the worst abdominal pain ever" or develop nausea or vomiting, please contact the office immediatly for further recommendations for treatment. ° °ITCHING ° If you experience itching with your medications, try taking only a single pain pill, or even half a pain pill at a time.  You can also use Benadryl over the counter for itching or also to help with sleep.  ° °TED HOSE STOCKINGS °Wear the elastic stockings on both legs for three weeks following surgery during the day but you may remove then at night for sleeping. ° °MEDICATIONS °See your medication summary on the “After Visit Summary” that the nursing staff will review with you prior to discharge.  You may have some home medications which will be placed on hold until you complete the course of blood thinner medication.  It is important for you to complete the blood thinner medication as prescribed by your surgeon.  Continue your approved medications as instructed at time of discharge. ° °PRECAUTIONS °If you experience chest pain or shortness of breath - call 911 immediately for transfer to the hospital emergency department.  °If you develop a fever greater that 101 F,  purulent drainage from wound, increased redness or drainage from wound, foul odor from the wound/dressing, or calf pain - CONTACT YOUR SURGEON.   °                                                °FOLLOW-UP APPOINTMENTS °Make sure you keep all of your appointments after your operation with your surgeon and caregivers. You should call the office at the above phone number and make an appointment for approximately two weeks after the date of your surgery or on the date instructed by your surgeon outlined in the "After Visit Summary". ° °RANGE OF MOTION AND STRENGTHENING EXERCISES  °These exercises are designed to help you keep full movement of your hip joint. Follow your caregiver's or physical therapist's instructions. Perform all exercises about fifteen times, three times per day or as directed. Exercise both hips, even if you   have had only one joint replacement. These exercises can be done on a training (exercise) mat, on the floor, on a table or on a bed. Use whatever works the best and is most comfortable for you. Use music or television while you are exercising so that the exercises are a pleasant break in your day. This will make your life better with the exercises acting as a break in routine you can look forward to.  °Lying on your back, slowly slide your foot toward your buttocks, raising your knee up off the floor. Then slowly slide your foot back down until your leg is straight again.  °Lying on your back spread your legs as far apart as you can without causing discomfort.  °Lying on your side, raise your upper leg and foot straight up from the floor as far as is comfortable. Slowly lower the leg and repeat.  °Lying on your back, tighten up the muscle in the front of your thigh (quadriceps muscles). You can do this by keeping your leg straight and trying to raise your heel off the floor. This helps strengthen the largest muscle supporting your knee.  °Lying on your back, tighten up the muscles of your  buttocks both with the legs straight and with the knee bent at a comfortable angle while keeping your heel on the floor.  ° °IF YOU ARE TRANSFERRED TO A SKILLED REHAB FACILITY °If the patient is transferred to a skilled rehab facility following release from the hospital, a list of the current medications will be sent to the facility for the patient to continue.  When discharged from the skilled rehab facility, please have the facility set up the patient's Home Health Physical Therapy prior to being released. Also, the skilled facility will be responsible for providing the patient with their medications at time of release from the facility to include their pain medication, the muscle relaxants, and their blood thinner medication. If the patient is still at the rehab facility at time of the two week follow up appointment, the skilled rehab facility will also need to assist the patient in arranging follow up appointment in our office and any transportation needs. ° °MAKE SURE YOU:  °Understand these instructions.  °Get help right away if you are not doing well or get worse.  ° ° °Pick up stool softner and laxative for home use following surgery while on pain medications. °Do not submerge incision under water. °Please use good hand washing techniques while changing dressing each day. °May shower starting three days after surgery. °Please use a clean towel to pat the incision dry following showers. °Continue to use ice for pain and swelling after surgery. °Do not use any lotions or creams on the incision until instructed by your surgeon. ° °Take Xarelto for two and a half more weeks following discharge from the hospital, then discontinue Xarelto. °Once the patient has completed the blood thinner regimen, then take a Baby 81 mg Aspirin daily for three more weeks. ° ° ° ° °Information on my medicine - XARELTO® (Rivaroxaban) ° °Why was Xarelto® prescribed for you? °Xarelto® was prescribed for you to reduce the risk of blood  clots forming after orthopedic surgery. The medical term for these abnormal blood clots is venous thromboembolism (VTE). ° °What do you need to know about xarelto® ? °Take your Xarelto® ONCE DAILY at the same time every day. °You may take it either with or without food. ° °If you have difficulty swallowing the tablet whole, you may   crush it and mix in applesauce just prior to taking your dose. ° °Take Xarelto® exactly as prescribed by your doctor and DO NOT stop taking Xarelto® without talking to the doctor who prescribed the medication.  Stopping without other VTE prevention medication to take the place of Xarelto® may increase your risk of developing a clot. ° °After discharge, you should have regular check-up appointments with your healthcare provider that is prescribing your Xarelto®.   ° °What do you do if you miss a dose? °If you miss a dose, take it as soon as you remember on the same day then continue your regularly scheduled once daily regimen the next day. Do not take two doses of Xarelto® on the same day.  ° °Important Safety Information °A possible side effect of Xarelto® is bleeding. You should call your healthcare provider right away if you experience any of the following: °? Bleeding from an injury or your nose that does not stop. °? Unusual colored urine (red or dark brown) or unusual colored stools (red or black). °? Unusual bruising for unknown reasons. °? A serious fall or if you hit your head (even if there is no bleeding). ° °Some medicines may interact with Xarelto® and might increase your risk of bleeding while on Xarelto®. To help avoid this, consult your healthcare provider or pharmacist prior to using any new prescription or non-prescription medications, including herbals, vitamins, non-steroidal anti-inflammatory drugs (NSAIDs) and supplements. ° °This website has more information on Xarelto®: www.xarelto.com. ° ° ° °

## 2016-08-06 NOTE — Anesthesia Postprocedure Evaluation (Signed)
Anesthesia Post Note  Patient: [name]  Procedure(s) Performed: Procedure(s) (LRB): LEFT TOTAL HIP ARTHROPLASTY ANTERIOR APPROACH (Left)     Patient location during evaluation: PACU Anesthesia Type: Spinal Level of consciousness: awake and alert, patient cooperative and oriented Pain management: pain level controlled Vital Signs Assessment: post-procedure vital signs reviewed and stable Respiratory status: spontaneous breathing, nonlabored ventilation, respiratory function stable and patient connected to nasal cannula oxygen Cardiovascular status: blood pressure returned to baseline and stable Postop Assessment: patient able to bend at knees, no signs of nausea or vomiting, no headache and spinal receding Anesthetic complications: no Comments: Non-positional headache in PACU has completely resolved.    Last Vitals:  Vitals:   08/06/16 1115 08/06/16 1130  BP: (!) 129/105 123/83  Pulse: (!) 55 (!) 51  Resp: 14 10  Temp:  (!) 36.3 C    Last Pain:  Vitals:   08/06/16 1130  TempSrc:   PainSc: 3                  [name],E. Penny Arrambide

## 2016-08-06 NOTE — Transfer of Care (Signed)
Immediate Anesthesia Transfer of Care Note  Patient: [name]  Procedure(s) Performed: Procedure(s): LEFT TOTAL HIP ARTHROPLASTY ANTERIOR APPROACH (Left)  Patient Location: PACU  Anesthesia Type:Spinal  Level of Consciousness: awake, alert  and oriented  Airway & Oxygen Therapy: Patient Spontanous Breathing and Patient connected to face mask oxygen  Post-op Assessment: Report given to RN and Post -op Vital signs reviewed and stable  Post vital signs: Reviewed and stable  Last Vitals:  Vitals:   08/06/16 0646  BP: 121/84  Pulse: 65  Resp: 16  Temp: 36.9 C    Last Pain:  Vitals:   08/06/16 0646  TempSrc: Oral      Patients Stated Pain Goal: 4 (96/72/89 7915)  Complications: No apparent anesthesia complications

## 2016-08-06 NOTE — Op Note (Signed)
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   POSTOPERATIVE DIAGNOSIS: Osteoarthritis of the Left  hip.   PROCEDURE: Left total hip arthroplasty, anterior approach.   SURGEON: Gaynelle Arabian, MD   ASSISTANT: Arlee Muslim, PA-C  ANESTHESIA:  Spinal  ESTIMATED BLOOD LOSS:-150 ml   DRAINS: Hemovac x1.   COMPLICATIONS: None   CONDITION: PACU - hemodynamically stable.   BRIEF CLINICAL NOTE: [name] is a 66 y.o. female who has advanced end-  stage arthritis of their Left  hip with progressively worsening pain and  dysfunction.The patient has failed nonoperative management and presents for  total hip arthroplasty.   PROCEDURE IN DETAIL: After successful administration of spinal  anesthetic, the traction boots for the [facility] bed were placed on both  feet and the patient was placed onto the [facility] bed, boots placed into the leg  holders. The Left hip was then isolated from the perineum with plastic  drapes and prepped and draped in the usual sterile fashion. ASIS and  greater trochanter were marked and a oblique incision was made, starting  at about 1 cm lateral and 2 cm distal to the ASIS and coursing towards  the anterior cortex of the femur. The skin was cut with a 10 blade  through subcutaneous tissue to the level of the fascia overlying the  tensor fascia lata muscle. The fascia was then incised in line with the  incision at the junction of the anterior third and posterior 2/3rd. The  muscle was teased off the fascia and then the interval between the TFL  and the rectus was developed. The Hohmann retractor was then placed at  the top of the femoral neck over the capsule. The vessels overlying the  capsule were cauterized and the fat on top of the capsule was removed.  A Hohmann retractor was then placed anterior underneath the rectus  femoris to give exposure to the entire anterior capsule. A T-shaped   capsulotomy was performed. The edges were tagged and the femoral head  was identified.       Osteophytes are removed off the superior acetabulum.  The femoral neck was then cut in situ with an oscillating saw. Traction  was then applied to the left lower extremity utilizing the [facility]  traction. The femoral head was then removed. Retractors were placed  around the acetabulum and then circumferential removal of the labrum was  performed. Osteophytes were also removed. Reaming starts at 45 mm to  medialize and  Increased in 2 mm increments to 49 mm. We reamed in  approximately 40 degrees of abduction, 20 degrees anteversion. A 50 mm  pinnacle acetabular shell was then impacted in anatomic position under  fluoroscopic guidance with excellent purchase. We did not need to place  any additional dome screws. A 32 mm neutral + 4 marathon liner was then  placed into the acetabular shell.       The femoral lift was then placed along the lateral aspect of the femur  just distal to the vastus ridge. The leg was  externally rotated and capsule  was stripped off the inferior aspect of the femoral neck down to the  level of the lesser trochanter, this was done with electrocautery. The femur was lifted after this was performed. The  leg was then placed in an extended and adducted position essentially delivering the femur. We also removed the capsule superiorly and the piriformis from the piriformis fossa  to gain excellent exposure of the  proximal femur. Rongeur was used to remove some cancellous bone to get  into the lateral portion of the proximal femur for placement of the  initial starter reamer. The starter broaches was placed  the starter broach  and was shown to go down the center of the canal. Broaching  with the  Corail system was then performed starting at size 8, coursing  Up to size 11. A size 11 had excellent torsional and rotational  and axial stability. The trial standard offset neck was then  placed  with a 32 + 1 trial head. The hip was then reduced. We confirmed that  the stem was in the canal both on AP and lateral x-rays. It also has excellent sizing. The hip was reduced with outstanding stability through full extension and full external rotation.. AP pelvis was taken and the leg lengths were measured and found to be equal. Hip was then dislocated again and the femoral head and neck removed. The  femoral broach was removed. Size 11 Corail stem with a standard offset  neck was then impacted into the femur following native anteversion. Has  excellent purchase in the canal. Excellent torsional and rotational and  axial stability. It is confirmed to be in the canal on AP and lateral  fluoroscopic views. The 32 + 1 ceramic head was placed and the hip  reduced with outstanding stability. Again AP pelvis was taken and it  confirmed that the leg lengths were equal. The wound was then copiously  irrigated with saline solution and the capsule reattached and repaired  with Ethibond suture. 30 ml of .25% Bupivicaine was  injected into the capsule and into the edge of the tensor fascia lata as well as subcutaneous tissue. The fascia overlying the tensor fascia lata was then closed with a running #1 V-Loc. Subcu was closed with interrupted 2-0 Vicryl and subcuticular running 4-0 Monocryl. Incision was cleaned  and dried. Steri-Strips and a bulky sterile dressing applied. Hemovac  drain was hooked to suction and then the patient was awakened and transported to  recovery in stable condition.        Please note that a surgical assistant was a medical necessity for this procedure to perform it in a safe and expeditious manner. Assistant was necessary to provide appropriate retraction of vital neurovascular structures and to prevent femoral fracture and allow for anatomic placement of the prosthesis.  Gaynelle Arabian, M.D.

## 2016-08-06 NOTE — Anesthesia Procedure Notes (Signed)
Spinal  Patient location during procedure: OR Start time: 08/06/2016 8:33 AM End time: 08/06/2016 8:36 AM Staffing Anesthesiologist: Annye Asa Resident/CRNA: Chrystine Oiler G Performed: resident/CRNA  Preanesthetic Checklist Completed: patient identified, site marked, surgical consent, pre-op evaluation, timeout performed, IV checked, risks and benefits discussed and monitors and equipment checked Spinal Block Patient position: sitting Prep: DuraPrep Patient monitoring: heart rate, continuous pulse ox and blood pressure Approach: midline Location: L2-3 Injection technique: single-shot Needle Needle type: Pencan  Needle gauge: 24 G Needle length: 10 cm Needle insertion depth: 5 cm Assessment Sensory level: T6 Additional Notes Kit expiration date checked and padded.  -heme, -paraesthesia, +CSF pre and post injection, patient tolerated well.

## 2016-08-06 NOTE — Evaluation (Signed)
Physical Therapy Evaluation Patient Details Name: [name] MRN: [id number] DOB: 03/19/1950 Today's Date: 08/06/2016   History of Present Illness  LEFT TOTAL HIP ARTHROPLASTY ANTERIOR APPROACH (Left), H/O right posterior  TH with revision  Clinical Impression  The patient ambulated  Short distance today. Plans  Home, ? Tomorrow. Pt admitted with above diagnosis. Pt currently with functional limitations due to the deficits listed below (see PT Problem List).  Pt will benefit from skilled PT to increase their independence and safety with mobility to allow discharge to the venue listed below.       Follow Up Recommendations Home health PT;DC plan and follow up therapy as arranged by surgeon    Equipment Recommendations  None recommended by PT    Recommendations for Other Services       Precautions / Restrictions Precautions Precautions: Fall      Mobility  Bed Mobility Overal bed mobility: Needs Assistance Bed Mobility: Supine to Sit     Supine to sit: Min assist     General bed mobility comments: support left leg  Transfers Overall transfer level: Needs assistance Equipment used: Rolling walker (2 wheeled) Transfers: Sit to/from Stand Sit to Stand: Min assist         General transfer comment: cues for hand and left  leg position  Ambulation/Gait Ambulation/Gait assistance: Min assist Ambulation Distance (Feet): 15 Feet Assistive device: Rolling walker (2 wheeled) Gait Pattern/deviations: Step-to pattern     General Gait Details: cues for sequence  Stairs            Wheelchair Mobility    Modified Rankin (Stroke Patients Only)       Balance                                             Pertinent Vitals/Pain Pain Assessment: 0-10 Pain Score: 4  Pain Location: left thigh Pain Descriptors / Indicators: Aching;Sore Pain Intervention(s): Monitored during session;Premedicated before session;Repositioned;Ice applied     Home Living Family/patient expects to be discharged to:: Private residence Living Arrangements: Spouse/significant other Available Help at Discharge: Family Type of Home: House Home Access: [place]: One [employer] [place]: Environmental consultant - 2 wheels;Cane - single point;Bedside commode;Adaptive equipment      Prior Function Level of Independence: Independent               Hand Dominance   Dominant Hand: Right    Extremity/Trunk Assessment   Upper Extremity Assessment Upper Extremity Assessment: Defer to OT evaluation    Lower Extremity Assessment Lower Extremity Assessment: LLE deficits/detail LLE Deficits / Details: advances the leg    Cervical / Trunk Assessment Cervical / Trunk Assessment: Normal  Communication   Communication: No difficulties  Cognition Arousal/Alertness: Awake/alert Behavior During Therapy: WFL for tasks assessed/performed Overall Cognitive Status: Within Functional Limits for tasks assessed                                        General Comments      Exercises     Assessment/Plan    PT Assessment Patient needs continued PT services  PT Problem List Decreased strength;Decreased range of motion;Decreased activity tolerance;Decreased mobility;Decreased knowledge of precautions;Decreased safety awareness;Decreased knowledge of use of DME;Pain  PT Treatment Interventions DME instruction;Gait training;Functional mobility training;Stair training;Therapeutic activities;Therapeutic exercise;Patient/family education    PT Goals (Current goals can be found in the Care Plan section)  Acute Rehab PT Goals Patient Stated Goal: to hike PT Goal Formulation: With patient Time For Goal Achievement: 08/09/16 Potential to Achieve Goals: Good    Frequency 7X/week   Barriers to discharge        Co-evaluation               AM-PAC PT "6 Clicks" Daily Activity  Outcome Measure Difficulty  turning over in bed (including adjusting bedclothes, sheets and blankets)?: Total Difficulty moving from lying on back to sitting on the side of the bed? : Total Difficulty sitting down on and standing up from a chair with arms (e.g., wheelchair, bedside commode, etc,.)?: Total Help needed moving to and from a bed to chair (including a wheelchair)?: Total Help needed walking in hospital room?: Total Help needed climbing 3-5 steps with a railing? : Total 6 Click Score: 6    End of Session   Activity Tolerance: Patient tolerated treatment well Patient left: in chair;with call bell/phone within reach;with family/visitor present Nurse Communication: Mobility status PT Visit Diagnosis: Difficulty in walking, not elsewhere classified (R26.2);Pain Pain - Right/Left: Left Pain - part of body: Hip    Time: 5697-9480 PT Time Calculation (min) (ACUTE ONLY): 29 min   Charges:   PT Evaluation $PT Eval Low Complexity: 1 Procedure PT Treatments $Gait Training: 8-22 mins   PT G CodesTresa Garcia PT [phone]  [name] 08/06/2016, 5:48 PM

## 2016-08-07 ENCOUNTER — Encounter (HOSPITAL_COMMUNITY): Payer: Self-pay | Admitting: Orthopedic Surgery

## 2016-08-07 LAB — BASIC METABOLIC PANEL
Anion gap: 5 (ref 5–15)
BUN: 9 mg/dL (ref 6–20)
CALCIUM: 8.9 mg/dL (ref 8.9–10.3)
CO2: 28 mmol/L (ref 22–32)
CREATININE: 0.69 mg/dL (ref 0.44–1.00)
Chloride: 107 mmol/L (ref 101–111)
GFR calc Af Amer: >60 mL/min (ref >60)
GFR calc non Af Amer: >60 mL/min (ref >60)
GLUCOSE: 126 mg/dL — AB (ref 65–99)
Potassium: 4.2 mmol/L (ref 3.5–5.1)
Sodium: 140 mmol/L (ref 135–145)

## 2016-08-07 LAB — CBC
HEMATOCRIT: 34.2 % — AB (ref 36.0–46.0)
Hemoglobin: 11.6 g/dL — ABNORMAL LOW (ref 12.0–15.0)
MCH: 32.3 pg (ref 26.0–34.0)
MCHC: 33.9 g/dL (ref 30.0–36.0)
MCV: 95.3 fL (ref 78.0–100.0)
Platelets: 271 10*3/uL (ref 150–400)
RBC: 3.59 MIL/uL — ABNORMAL LOW (ref 3.87–5.11)
RDW: 12.7 % (ref 11.5–15.5)
WBC: 12.2 10*3/uL — AB (ref 4.0–10.5)

## 2016-08-07 MED ORDER — CYCLOBENZAPRINE HCL 10 MG PO TABS: 5.0000 mg | ORAL_TABLET | Freq: Three times a day (TID) | ORAL | 0 refills | Status: DC | PRN

## 2016-08-07 MED ORDER — RIVAROXABAN 10 MG PO TABS: 10.0000 mg | ORAL_TABLET | Freq: Every day | ORAL | 0 refills | Status: DC

## 2016-08-07 MED ORDER — SODIUM CHLORIDE 0.9 % IV BOLUS (SEPSIS)
250.0000 mL | Freq: Once | INTRAVENOUS | Status: AC
  Administered 2016-08-07: 250 mL via INTRAVENOUS

## 2016-08-07 MED ORDER — HYDROCODONE-ACETAMINOPHEN 5-325 MG PO TABS: 0.5000 | ORAL_TABLET | ORAL | 0 refills | Status: DC | PRN

## 2016-08-07 NOTE — Progress Notes (Signed)
Physical Therapy Treatment Patient Details Name: [name] MRN: [id number] DOB: 1950-12-19 Today's Date: 08/07/2016    History of Present Illness LEFT TOTAL HIP ARTHROPLASTY ANTERIOR APPROACH (Left), H/O right posterior  TH with revision    PT Comments    The patient is progressing well. Discussed gradual wean from RW to [facility].Marland Kitchen plans DC after  PT this PM.   Follow Up Recommendations  Supervision/Assistance - 24 hour;DC plan and follow up therapy as arranged by surgeon     Equipment Recommendations  None recommended by PT    Recommendations for Other Services       Precautions / Restrictions Precautions Precautions: Fall    Mobility  Bed Mobility   Bed Mobility: Supine to Sit     Supine to sit: Min guard     General bed mobility comments: support left leg  Transfers Overall transfer level: Needs assistance   Transfers: Sit to/from Stand Sit to Stand: Supervision         General transfer comment: cues for hand and left  leg position  Ambulation/Gait Ambulation/Gait assistance: Supervision Ambulation Distance (Feet): 120 Feet Assistive device: Rolling walker (2 wheeled) Gait Pattern/deviations: Step-to pattern;Step-through pattern;Antalgic     General Gait Details: cues for sequence   Stairs            Wheelchair Mobility    Modified Rankin (Stroke Patients Only)       Balance                                            Cognition Arousal/Alertness: Awake/alert                                            Exercises Total Joint Exercises Ankle Circles/Pumps: AROM;Both;10 reps Quad Sets: AROM;Both;10 reps Short Arc Quad: AROM;Left;10 reps Heel Slides: AAROM;Left Hip ABduction/ADduction: AAROM;Left Marching in Standing: AAROM;Seated;Left;10 reps    General Comments        Pertinent Vitals/Pain Pain Score: 4  Pain Location: left thigh Pain Descriptors / Indicators: Aching;Sore Pain  Intervention(s): Premedicated before session;Repositioned;Ice applied    Home Living                      Prior Function            PT Goals (current goals can now be found in the care plan section) Progress towards PT goals: Progressing toward goals    Frequency    7X/week      PT Plan Current plan remains appropriate    Co-evaluation              AM-PAC PT "6 Clicks" Daily Activity  Outcome Measure  Difficulty turning over in bed (including adjusting bedclothes, sheets and blankets)?: Total Difficulty moving from lying on back to sitting on the side of the bed? : Total Difficulty sitting down on and standing up from a chair with arms (e.g., wheelchair, bedside commode, etc,.)?: Total Help needed moving to and from a bed to chair (including a wheelchair)?: Total Help needed walking in hospital room?: Total Help needed climbing 3-5 steps with a railing? : Total 6 Click Score: 6    End of Session   Activity Tolerance: Patient tolerated treatment well Patient left: in chair;with  call bell/phone within reach Nurse Communication: Mobility status PT Visit Diagnosis: Difficulty in walking, not elsewhere classified (R26.2);Pain Pain - Right/Left: Left Pain - part of body: Hip     Time: 4540-9811 PT Time Calculation (min) (ACUTE ONLY): 23 min  Charges:  $Gait Training: 8-22 mins $Therapeutic Exercise: 8-22 mins                    G CodesTresa Endo PT [phone]    Claretha Cooper 08/07/2016, 11:41 AM

## 2016-08-07 NOTE — Care Management Note (Signed)
Case Management Note  Patient Details  Name: [name] MRN: [id number] Date of Birth: April 08, 1950  Subjective/Objective: 66 y/o f admitted w/PA hip. From  Home. D/c plan home exercises.Has dme.No further CM needs.                   Action/Plan:d/c home.   Expected Discharge Date:  08/07/16               Expected Discharge Plan:  Home/Self Care  In-House Referral:     Discharge planning Services  CM Consult  Post Acute Care Choice:    Choice offered to:     DME Arranged:    DME Agency:     HH Arranged:    HH Agency:     Status of Service:  Completed, signed off  If discussed at [employer] of Stay Meetings, dates discussed:    Additional Comments:  Dessa Phi, RN 08/07/2016, 1:26 PM

## 2016-08-07 NOTE — Discharge Summary (Signed)
Physician Discharge Summary   Patient ID: [name] MRN: [id number] DOB/AGE: October 11, 1950 66 y.o.  Admit date: 08/06/2016 Discharge date: 08/07/2016  Primary Diagnosis:  Osteoarthritis of the Left hip.   Admission Diagnoses:  Past Medical History:  Diagnosis Date  . Allergy   . Arthritis    osteoarthritis, osteoporosis. Past hx. of skin staph infections-no problems at present  . Cancer ([place])    skin-squamous cell right clavicle area- no present problems  . H/O oophorectomy    '99  . H/O tinnitus    bilateral mild  . Hx of seasonal allergies    Discharge Diagnoses:   Principal Problem:   OA (osteoarthritis) of hip  Estimated body mass index is 22.66 kg/m as calculated from the following:   Height as of this encounter: 5' 4"  (1.626 m).   Weight as of this encounter: 59.9 kg (132 lb).  Procedure(s) (LRB): LEFT TOTAL HIP ARTHROPLASTY ANTERIOR APPROACH (Left)   Consults: None  HPI: [name] is a 66 y.o. female who has advanced end-  stage arthritis of their Left  hip with progressively worsening pain and  dysfunction.The patient has failed nonoperative management and presents for  total hip arthroplasty.   Laboratory Data: Admission on 08/06/2016  Component Date Value Ref Range Status  . WBC 08/07/2016 12.2* 4.0 - 10.5 K/uL Final  . RBC 08/07/2016 3.59* 3.87 - 5.11 MIL/uL Final  . Hemoglobin 08/07/2016 11.6* 12.0 - 15.0 g/dL Final  . HCT 08/07/2016 34.2* 36.0 - 46.0 % Final  . MCV 08/07/2016 95.3  78.0 - 100.0 fL Final  . MCH 08/07/2016 32.3  26.0 - 34.0 pg Final  . MCHC 08/07/2016 33.9  30.0 - 36.0 g/dL Final  . RDW 08/07/2016 12.7  11.5 - 15.5 % Final  . Platelets 08/07/2016 271  150 - 400 K/uL Final  . Sodium 08/07/2016 140  135 - 145 mmol/L Final  . Potassium 08/07/2016 4.2  3.5 - 5.1 mmol/L Final  . Chloride 08/07/2016 107  101 - 111 mmol/L Final  . CO2 08/07/2016 28  22 - 32 mmol/L Final  . Glucose, Bld 08/07/2016 126* 65 - 99 mg/dL Final  . BUN  08/07/2016 9  6 - 20 mg/dL Final  . Creatinine, Ser 08/07/2016 0.69  0.44 - 1.00 mg/dL Final  . Calcium 08/07/2016 8.9  8.9 - 10.3 mg/dL Final  . GFR calc non Af Amer 08/07/2016 >60  >60 mL/min Final  . GFR calc Af Amer 08/07/2016 >60  >60 mL/min Final   Comment: (NOTE) The eGFR has been calculated using the CKD EPI equation. This calculation has not been validated in all clinical situations. eGFR's persistently <60 mL/min signify possible Chronic Kidney Disease.   Georgiann Hahn gap 08/07/2016 5  5 - 15 Final  Hospital Outpatient Visit on 07/28/2016  Component Date Value Ref Range Status  . aPTT 07/28/2016 30  24 - 36 seconds Final  . WBC 07/28/2016 7.0  4.0 - 10.5 K/uL Final  . RBC 07/28/2016 4.48  3.87 - 5.11 MIL/uL Final  . Hemoglobin 07/28/2016 14.5  12.0 - 15.0 g/dL Final  . HCT 07/28/2016 42.7  36.0 - 46.0 % Final  . MCV 07/28/2016 95.3  78.0 - 100.0 fL Final  . MCH 07/28/2016 32.4  26.0 - 34.0 pg Final  . MCHC 07/28/2016 34.0  30.0 - 36.0 g/dL Final  . RDW 07/28/2016 12.5  11.5 - 15.5 % Final  . Platelets 07/28/2016 298  150 - 400 K/uL Final  .  Sodium 07/28/2016 142  135 - 145 mmol/L Final  . Potassium 07/28/2016 4.3  3.5 - 5.1 mmol/L Final  . Chloride 07/28/2016 106  101 - 111 mmol/L Final  . CO2 07/28/2016 30  22 - 32 mmol/L Final  . Glucose, Bld 07/28/2016 106* 65 - 99 mg/dL Final  . BUN 07/28/2016 17  6 - 20 mg/dL Final  . Creatinine, Ser 07/28/2016 0.86  0.44 - 1.00 mg/dL Final  . Calcium 07/28/2016 9.9  8.9 - 10.3 mg/dL Final  . Total Protein 07/28/2016 7.0  6.5 - 8.1 g/dL Final  . Albumin 07/28/2016 4.3  3.5 - 5.0 g/dL Final  . AST 07/28/2016 24  15 - 41 U/L Final  . ALT 07/28/2016 17  14 - 54 U/L Final  . Alkaline Phosphatase 07/28/2016 60  38 - 126 U/L Final  . Total Bilirubin 07/28/2016 0.8  0.3 - 1.2 mg/dL Final  . GFR calc non Af Amer 07/28/2016 >60  >60 mL/min Final  . GFR calc Af Amer 07/28/2016 >60  >60 mL/min Final   Comment: (NOTE) The eGFR has been  calculated using the CKD EPI equation. This calculation has not been validated in all clinical situations. eGFR's persistently <60 mL/min signify possible Chronic Kidney Disease.   . Anion gap 07/28/2016 6  5 - 15 Final  . Prothrombin Time 07/28/2016 13.3  11.4 - 15.2 seconds Final  . INR 07/28/2016 1.01   Final  . ABO/RH(D) 07/28/2016 A POS   Final  . Antibody Screen 07/28/2016 NEG   Final  . Sample Expiration 07/28/2016 08/09/2016   Final  . Extend sample reason 07/28/2016 NO TRANSFUSIONS OR PREGNANCY IN THE PAST 3 MONTHS   Final  . MRSA, PCR 07/28/2016 NEGATIVE  NEGATIVE Final  . Staphylococcus aureus 07/28/2016 POSITIVE* NEGATIVE Final   Comment:        The Xpert SA Assay (FDA approved for NASAL specimens in patients over 54 years of age), is one component of a comprehensive surveillance program.  Test performance has been validated by [facility] for patients greater than or equal to 8 year old. It is not intended to diagnose infection nor to guide or monitor treatment.      X-Rays:Dg Pelvis Portable  Result Date: 08/06/2016 CLINICAL DATA:  Status post left total hip joint prosthesis placement. EXAM: PORTABLE PELVIS 1-2 VIEWS COMPARISON:  Intraoperative fluoro spot image of earlier today. FINDINGS: A prosthetic left hip joint is present on the left. Radiographic positioning appears good. The interface with the native bone is normal. No acute native bone abnormality is observed. A surgical drain line is present. IMPRESSION: There is no immediate postprocedure complication following left total hip joint prosthesis placement. Electronically Signed   By: David  Martinique M.D.   On: 08/06/2016 11:48   Dg C-arm 1-60 Min-no Report  Result Date: 08/06/2016 Fluoroscopy was utilized by the requesting physician.  No radiographic interpretation.    EKG:No orders found for this or any previous visit.   Hospital Course: Patient was admitted to [facility] and taken to the OR and  underwent the above state procedure without complications.  Patient tolerated the procedure well and was later transferred to the recovery room and then to the orthopaedic floor for postoperative care.  They were given PO and IV analgesics for pain control following their surgery.  They were given 24 hours of postoperative antibiotics of  Anti-infectives    Start     Dose/Rate Route Frequency Ordered Stop   08/06/16  1500  ceFAZolin (ANCEF) IVPB 2g/100 mL premix     2 g 200 mL/hr over 30 Minutes Intravenous Every 6 hours 08/06/16 1210 08/06/16 2203   08/06/16 0646  ceFAZolin (ANCEF) 2-4 GM/100ML-% IVPB    Comments:  [place] Session   : cabinet override      08/06/16 [phone] 08/06/16 0837   08/06/16 0644  ceFAZolin (ANCEF) IVPB 2g/100 mL premix     2 g 200 mL/hr over 30 Minutes Intravenous On call to O.R. 08/06/16 9735 08/06/16 0907     and started on DVT prophylaxis in the form of Xarelto.   PT and OT were ordered for total hip protocol.  The patient was allowed to be WBAT with therapy. Discharge planning was consulted to help with postop disposition and equipment needs.  Patient had a very good night on the evening of surgery.  They started to get up OOB with therapy on day one.  Hemovac drain was pulled without difficulty.  Dressing was checked and was clean and dry.  Patient was seen in rounds by Dr. Wynelle Link and was felt to be ready to go home later that same day.  Diet - Regular diet Follow up - in 2 weeks Activity - WBAT Disposition - Home Condition Upon Discharge -stable D/C Meds - See DC Summary DVT Prophylaxis - Xarelto   Discharge Instructions    Call MD / Call 911    Complete by:  As directed    If you experience chest pain or shortness of breath, CALL 911 and be transported to the hospital emergency room.  If you develope a fever above 101 F, pus (white drainage) or increased drainage or redness at the wound, or calf pain, call your surgeon's office.   Change dressing    Complete  by:  As directed    You may change your dressing dressing daily with sterile 4 x 4 inch gauze dressing and paper tape.  Do not submerge the incision under water.   Constipation Prevention    Complete by:  As directed    Drink plenty of fluids.  Prune juice may be helpful.  You may use a stool softener, such as Colace (over the counter) 100 mg twice a day.  Use MiraLax (over the counter) for constipation as needed.   Diet general    Complete by:  As directed    Discharge instructions    Complete by:  As directed    Take Xarelto for two and a half more weeks, then discontinue Xarelto. Once the patient has completed the blood thinner regimen, then take a Baby 81 mg Aspirin daily for three more weeks.   Pick up stool softner and laxative for home use following surgery while on pain medications. Do not submerge incision under water. Please use good hand washing techniques while changing dressing each day. May shower starting three days after surgery. Please use a clean towel to pat the incision dry following showers. Continue to use ice for pain and swelling after surgery. Do not use any lotions or creams on the incision until instructed by your surgeon.  Wear both TED hose on both legs during the day every day for three weeks, but may remove the TED hose at night at home.  Postoperative Constipation Protocol  Constipation - defined medically as fewer than three stools per week and severe constipation as less than one stool per week.  One of the most common issues patients have following surgery is constipation.  Even if  you have a regular bowel pattern at home, your normal regimen is likely to be disrupted due to multiple reasons following surgery.  Combination of anesthesia, postoperative narcotics, change in appetite and fluid intake all can affect your bowels.  In order to avoid complications following surgery, here are some recommendations in order to help you during your recovery  period.  Colace (docusate) - Pick up an over-the-counter form of Colace or another stool softener and take twice a day as long as you are requiring postoperative pain medications.  Take with a full glass of water daily.  If you experience loose stools or diarrhea, hold the colace until you stool forms back up.  If your symptoms do not get better within 1 week or if they get worse, check with your doctor.  Dulcolax (bisacodyl) - Pick up over-the-counter and take as directed by the product packaging as needed to assist with the movement of your bowels.  Take with a full glass of water.  Use this product as needed if not relieved by Colace only.   MiraLax (polyethylene glycol) - Pick up over-the-counter to have on hand.  MiraLax is a solution that will increase the amount of water in your bowels to assist with bowel movements.  Take as directed and can mix with a glass of water, juice, soda, coffee, or tea.  Take if you go more than two days without a movement. Do not use MiraLax more than once per day. Call your doctor if you are still constipated or irregular after using this medication for 7 days in a row.  If you continue to have problems with postoperative constipation, please contact the office for further assistance and recommendations.  If you experience "the worst abdominal pain ever" or develop nausea or vomiting, please contact the office immediatly for further recommendations for treatment.   Do not sit on low chairs, stoools or toilet seats, as it may be difficult to get up from low surfaces    Complete by:  As directed    Driving restrictions    Complete by:  As directed    No driving until released by the physician.   Increase activity slowly as tolerated    Complete by:  As directed    Lifting restrictions    Complete by:  As directed    No lifting until released by the physician.   Patient may shower    Complete by:  As directed    You may shower without a dressing once there is  no drainage.  Do not wash over the wound.  If drainage remains, do not shower until drainage stops.   TED hose    Complete by:  As directed    Use stockings (TED hose) for 3 weeks on both leg(s).  You may remove them at night for sleeping.   Weight bearing as tolerated    Complete by:  As directed    Laterality:  left   Extremity:  Lower     Allergies as of 08/07/2016      Reactions   Dilaudid [hydromorphone Hcl] Nausea And Vomiting   Fentanyl Nausea And Vomiting   Minocycline Other (See Comments)   Toxicity reaction  Toxicity reaction    Oxycodone Nausea And Vomiting      Medication List    STOP taking these medications   cholecalciferol 1000 units tablet Commonly known as:  VITAMIN D   Fish Oil 1200 MG Caps   ibuprofen 200 MG tablet Commonly known  as:  ADVIL,MOTRIN   multivitamin tablet   vitamin E 400 UNIT capsule     TAKE these medications   cetirizine 10 MG tablet Commonly known as:  ZYRTEC Take 10 mg by mouth daily.   clindamycin 1 % gel Commonly known as:  CLINDAGEL Apply 1 application topically daily.   cyclobenzaprine 10 MG tablet Commonly known as:  FLEXERIL Take 0.5 tablets (5 mg total) by mouth 3 (three) times daily as needed for muscle spasms.   fexofenadine 180 MG tablet Commonly known as:  ALLEGRA Take 180 mg by mouth daily.   HYDROcodone-acetaminophen 5-325 MG tablet Commonly known as:  NORCO/VICODIN Take 0.5 tablets by mouth every 4 (four) hours as needed for moderate pain (lower back pain). What changed:  when to take this   magnesium oxide 400 MG tablet Commonly known as:  MAG-OX Take 400 mg by mouth every evening.   montelukast 10 MG tablet Commonly known as:  SINGULAIR Take 10 mg by mouth at bedtime.   NASACORT AQ 55 MCG/ACT Aero nasal inhaler Generic drug:  triamcinolone Place 2 sprays into the nose at bedtime.   psyllium 0.52 g capsule Commonly known as:  REGULOID Take 3 capsules by mouth 2 (two) times daily. 6 caps a day    rivaroxaban 10 MG Tabs tablet Commonly known as:  XARELTO Take 1 tablet (10 mg total) by mouth daily with breakfast. Take Xarelto for two and a half more weeks following discharge from the hospital, then discontinue Xarelto. Once the patient has completed the blood thinner regimen, then take a Baby 81 mg Aspirin daily for three more weeks.   tretinoin 0.025 % cream Commonly known as:  RETIN-A Apply 1 application topically at bedtime.   zolpidem 5 MG tablet Commonly known as:  AMBIEN Take 5 mg by mouth at bedtime as needed for sleep.      Follow-up Information    Gaynelle Arabian, MD. Schedule an appointment as soon as possible for a visit on 08/19/2016.   Specialty:  Orthopedic Surgery Contact information: [address] [place] [address] [phone]           Signed: Arlee Muslim, PA-C Orthopaedic Surgery 08/07/2016, 7:54 AM

## 2016-08-07 NOTE — Progress Notes (Signed)
Physical Therapy Treatment Patient Details Name: [name] MRN: [id number] DOB: 1950-03-08 Today's Date: 08/07/2016    History of Present Illness LEFT TOTAL HIP ARTHROPLASTY ANTERIOR APPROACH (Left), H/O right posterior  TH with revision    PT Comments    Ready for Discharge. Reviewed progression of ambulation to cane and exercises.   Follow Up Recommendations  DC plan and follow up therapy as arranged by surgeon     Equipment Recommendations  None recommended by PT    Recommendations for Other Services       Precautions / Restrictions Precautions Precautions: Fall    Mobility  Bed Mobility   Bed Mobility: Supine to Sit     Supine to sit: Min guard     General bed mobility comments: in recliner  Transfers Overall transfer level: Needs assistance Equipment used: Rolling walker (2 wheeled) Transfers: Sit to/from Stand Sit to Stand: Supervision         General transfer comment: cues for hand and left  leg position  Ambulation/Gait Ambulation/Gait assistance: Supervision Ambulation Distance (Feet): 200 Feet Assistive device: Rolling walker (2 wheeled) Gait Pattern/deviations: Step-to pattern;Step-through pattern;Antalgic     General Gait Details: cues for sequence   Stairs            Wheelchair Mobility    Modified Rankin (Stroke Patients Only)       Balance                                            Cognition Arousal/Alertness: Awake/alert                                            Exercises Total Joint Exercises Ankle Circles/Pumps: AROM;Both;10 reps Quad Sets: AROM;Both;10 reps Short Arc Quad: AROM;Left;10 reps Heel Slides: AAROM;Left;10 reps Hip ABduction/ADduction: AAROM;Left;10 reps Long Arc Quad: AROM;Left;10 reps;Seated Marching in Standing: AAROM;Seated;Left;10 reps    General Comments        Pertinent Vitals/Pain Pain Score: 4  Pain Location: left thigh Pain Descriptors /  Indicators: Aching;Sore Pain Intervention(s): Monitored during session;Premedicated before session;Repositioned;Ice applied;Patient requesting pain meds-RN notified    Home Living                      Prior Function            PT Goals (current goals can now be found in the care plan section) Progress towards PT goals: Progressing toward goals    Frequency    7X/week      PT Plan Current plan remains appropriate    Co-evaluation              AM-PAC PT "6 Clicks" Daily Activity  Outcome Measure  Difficulty turning over in bed (including adjusting bedclothes, sheets and blankets)?: A Little Difficulty moving from lying on back to sitting on the side of the bed? : A Little Difficulty sitting down on and standing up from a chair with arms (e.g., wheelchair, bedside commode, etc,.)?: Total Help needed moving to and from a bed to chair (including a wheelchair)?: A Little Help needed walking in hospital room?: A Little Help needed climbing 3-5 steps with a railing? : Total 6 Click Score: 14    End of Session   Activity  Tolerance: Patient tolerated treatment well Patient left: in chair;with call bell/phone within reach Nurse Communication: Mobility status PT Visit Diagnosis: Difficulty in walking, not elsewhere classified (R26.2);Pain Pain - Right/Left: Left Pain - part of body: Hip     Time: 1352-1405 PT Time Calculation (min) (ACUTE ONLY): 13 min  Charges:  $Gait Training: 8-22 mins $Therapeutic Exercise: 8-22 mins                    G Codes:       }   [name] 08/07/2016, 2:37 PM

## 2016-08-07 NOTE — Progress Notes (Signed)
   Subjective: 1 Day Post-Op Procedure(s) (LRB): LEFT TOTAL HIP ARTHROPLASTY ANTERIOR APPROACH (Left) Patient reports pain as mild.   Patient seen in rounds with Dr. Wynelle Link.  Doing well this morning. Patient is well, and has had no acute complaints or problems We will resume therapy today.  If they do well with therapy and meets all goals, then will allow home later this afternoon following therapy. Plan is to go Home after hospital stay.  Objective: Vital signs in last 24 hours: Temp:  [97.1 F (36.2 C)-98.6 F (37 C)] 98 F (36.7 C) (07/26 0514) Pulse Rate:  [51-68] 58 (07/26 0514) Resp:  [10-28] 16 (07/26 0514) BP: (96-135)/(63-105) 97/63 (07/26 0514) SpO2:  [96 %-100 %] 97 % (07/26 0514) Weight:  [59.9 kg (132 lb)] 59.9 kg (132 lb) (07/25 1157)  Intake/Output from previous day:  Intake/Output Summary (Last 24 hours) at 08/07/16 0747 Last data filed at 08/07/16 0204  Gross per 24 hour  Intake             3800 ml  Output             4090 ml  Net             -290 ml    Intake/Output this shift: No intake/output data recorded.  Labs:  Recent Labs  08/07/16 0542  HGB 11.6*    Recent Labs  08/07/16 0542  WBC 12.2*  RBC 3.59*  HCT 34.2*  PLT 271    Recent Labs  08/07/16 0542  NA 140  K 4.2  CL 107  CO2 28  BUN 9  CREATININE 0.69  GLUCOSE 126*  CALCIUM 8.9   No results for input(s): LABPT, INR in the last 72 hours.  EXAM General - Patient is Alert, Appropriate and Oriented Extremity - Neurovascular intact Sensation intact distally Intact pulses distally Dorsiflexion/Plantar flexion intact Dressing - dressing C/D/I Motor Function - intact, moving foot and toes well on exam.  Hemovac pulled without difficulty.  Past Medical History:  Diagnosis Date  . Allergy   . Arthritis    osteoarthritis, osteoporosis. Past hx. of skin staph infections-no problems at present  . Cancer ([place])    skin-squamous cell right clavicle area- no present problems    . H/O oophorectomy    '99  . H/O tinnitus    bilateral mild  . Hx of seasonal allergies     Assessment/Plan: 1 Day Post-Op Procedure(s) (LRB): LEFT TOTAL HIP ARTHROPLASTY ANTERIOR APPROACH (Left) Principal Problem:   OA (osteoarthritis) of hip  Estimated body mass index is 22.66 kg/m as calculated from the following:   Height as of this encounter: 5\' 4"  (1.626 m).   Weight as of this encounter: 59.9 kg (132 lb). Up with therapy Discharge home - No HHPT - Plan for HEP - Exercise sheet has been provided to the patient  DVT Prophylaxis - Xarelto Weight Bearing As Tolerated left Leg Hemovac Pulled Begin Therapy  If meets goals and able to go home: Diet - Regular diet Follow up - in 2 weeks Activity - WBAT Disposition - Home Condition Upon Discharge -pending therapy D/C Meds - See DC Summary DVT Prophylaxis - [place], PA-C Orthopaedic Surgery 08/07/2016, 7:47 AM

## 2016-12-03 ENCOUNTER — Ambulatory Visit (INDEPENDENT_AMBULATORY_CARE_PROVIDER_SITE_OTHER): Payer: 59 | Admitting: Family Medicine

## 2016-12-03 ENCOUNTER — Encounter: Payer: Self-pay | Admitting: Family Medicine

## 2016-12-03 ENCOUNTER — Other Ambulatory Visit: Payer: Self-pay

## 2016-12-03 VITALS — BP 90/64 | HR 81 | Temp 98.3°F | Ht 64.0 in | Wt 137.0 lb

## 2016-12-03 DIAGNOSIS — D045 Carcinoma in situ of skin of trunk: Secondary | ICD-10-CM | POA: Diagnosis not present

## 2016-12-03 DIAGNOSIS — M160 Bilateral primary osteoarthritis of hip: Secondary | ICD-10-CM

## 2016-12-03 DIAGNOSIS — J3089 Other allergic rhinitis: Secondary | ICD-10-CM | POA: Diagnosis not present

## 2016-12-03 DIAGNOSIS — Z780 Asymptomatic menopausal state: Secondary | ICD-10-CM

## 2016-12-03 DIAGNOSIS — Z0000 Encounter for general adult medical examination without abnormal findings: Secondary | ICD-10-CM | POA: Diagnosis not present

## 2016-12-03 NOTE — Patient Instructions (Signed)
Great to meet you! 

## 2016-12-04 LAB — LIPID PANEL
CHOLESTEROL TOTAL: 247 mg/dL — AB (ref 100–199)
Chol/HDL Ratio: 2.7 ratio (ref 0.0–4.4)
HDL: 92 mg/dL (ref >39)
LDL CALC: 136 mg/dL — AB (ref 0–99)
Triglycerides: 93 mg/dL (ref 0–149)
VLDL CHOLESTEROL CAL: 19 mg/dL (ref 5–40)

## 2016-12-05 DIAGNOSIS — Z780 Asymptomatic menopausal state: Secondary | ICD-10-CM | POA: Insufficient documentation

## 2016-12-05 DIAGNOSIS — Z0000 Encounter for general adult medical examination without abnormal findings: Secondary | ICD-10-CM | POA: Insufficient documentation

## 2016-12-05 DIAGNOSIS — J309 Allergic rhinitis, unspecified: Secondary | ICD-10-CM | POA: Insufficient documentation

## 2016-12-05 DIAGNOSIS — D045 Carcinoma in situ of skin of trunk: Secondary | ICD-10-CM | POA: Insufficient documentation

## 2016-12-05 NOTE — Progress Notes (Signed)
    CHIEF COMPLAINT / HPI:  New pt to my practice Has some questions about cholesterol Wants ears checked for cerumen active--swims 2x per week, gym and walking rest of week. Eats no red meet (pescitarian). Piano tuner by trade. Has arhtritis and has had one hop replaced twice and the other once. GYN done by outside physician Has dermatologist and allergy doctor  REVIEW OF SYSTEMS:  Review of Systems  Constitutional: Negative for activity chang; no  appetite change and no unexpected weight change.  Eyes: Negative for eye pain and no visual disturbance.  Neck: denies neck pain; no swallowing problems CV: No chest pain, no shortness of breath, no lower extremity edema. No change in exercise tolerance Respiratory: Negative for cough or wheezing.  No shortness of breath. Gastrointestinal: Negative for abdominal pain, no diarrhea and no  constipation.  Genitourinary: Negative for decreased urine volume and  no difficulty urinating.  Musculoskeletal: Negative for arthralgias. No muscle weakness. Skin: Negative for rash.  Psychiatric/Behavioral: Negative for behavioral problems; no sleep disturbance and no  agitation.     PERTINENT  PMH / PSH: I have reviewed the patient's medications, allergies, past medical and surgical history, smoking status and updated in the EMR as appropriate.   OBJECTIVE:    Vital signs reviewed GENERALl: Well developed, well nourished, in no acute distress. HEENT: PERRLA, EOMI, sclerae are nonicteric.  NECK: Supple, FROM, without lymphadenopathy.  THYROID: normal without nodularity CAROTID ARTERIES: without bruits HEENT:LUNGS: clear to auscultation bilaterally. No wheezes or rales. Normal respiratory effort no cerumen in ear canals. HEART: Regular rate and rhythm, no murmurs. Distal pulses are bilaterally symmetrical, 2+. ABDOMEN: soft with positive bowel sounds. No masses noted MSK: MOE x 4. Normal muscle strength, bulk and tone. SKIN no rash. Normal  temperature. NEURO: no focal deficits. Normal gait. Normal balance. PSYCH AXOX4. Normal affect. Intact judgement. Speech fluency and content normal.  ASSESSMENT / PLAN: Please see problem oriented charting for details

## 2016-12-05 NOTE — Assessment & Plan Note (Signed)
ge mammogram results Check cholesterol immuizations UTD Pap UTD Active

## 2017-03-19 ENCOUNTER — Other Ambulatory Visit: Payer: Self-pay | Admitting: *Deleted

## 2017-03-19 NOTE — Telephone Encounter (Signed)
Refill Request for Zolipidem Tartrate 10 mg tablet came in from pharmacy stating the rx they had before expired or is out of refills.  Did not see this dosage on current med list. Placed fax in PCP box. Katharina Caper, Ceria Suminski D, [place]

## 2017-03-20 MED ORDER — ZOLPIDEM TARTRATE 5 MG PO TABS: 5.0000 mg | ORAL_TABLET | Freq: Every evening | ORAL | 2 refills | Status: DC | PRN

## 2017-03-20 NOTE — Telephone Encounter (Signed)
Left detailed message informing pt of below. Zimmerman Rumple, Melony Tenpas D, CMA  

## 2017-03-20 NOTE — Telephone Encounter (Signed)
Pt wondering why her Rx was denied and I read off the phone note to her and told her the previous Rx was for 5 MG and the pharmacy is requesting a 10MG . Pt said she cuts the 10 MG in half when she takes it and the pills last longer since she just cuts them in half. She is fine with the 5 MG or the 10 MG, just let her know. She does like the 10 MG because they do last longer than the 5MG .

## 2017-03-20 NOTE — Telephone Encounter (Signed)
Dear [name] Team Please let her know that I sent the 5 mg in.  Her insurance does not want to do the 10 mg. [name]

## 2017-05-14 ENCOUNTER — Emergency Department (HOSPITAL_COMMUNITY): Payer: 59

## 2017-05-14 ENCOUNTER — Emergency Department (HOSPITAL_COMMUNITY)
Admission: EM | Admit: 2017-05-14 | Discharge: 2017-05-14 | Disposition: A | Discharge status: Home / Self Care | Payer: 59 | Attending: Emergency Medicine | Admitting: Emergency Medicine

## 2017-05-14 ENCOUNTER — Other Ambulatory Visit: Payer: Self-pay

## 2017-05-14 ENCOUNTER — Encounter (HOSPITAL_COMMUNITY): Payer: Self-pay

## 2017-05-14 DIAGNOSIS — Y939 Activity, unspecified: Secondary | ICD-10-CM | POA: Insufficient documentation

## 2017-05-14 DIAGNOSIS — S73004A Unspecified dislocation of right hip, initial encounter: Secondary | ICD-10-CM | POA: Diagnosis not present

## 2017-05-14 DIAGNOSIS — Z96643 Presence of artificial hip joint, bilateral: Secondary | ICD-10-CM | POA: Diagnosis not present

## 2017-05-14 DIAGNOSIS — S40812A Abrasion of left upper arm, initial encounter: Secondary | ICD-10-CM | POA: Insufficient documentation

## 2017-05-14 DIAGNOSIS — W010XXA Fall on same level from slipping, tripping and stumbling without subsequent striking against object, initial encounter: Secondary | ICD-10-CM | POA: Insufficient documentation

## 2017-05-14 DIAGNOSIS — S80212A Abrasion, left knee, initial encounter: Secondary | ICD-10-CM | POA: Insufficient documentation

## 2017-05-14 DIAGNOSIS — Z85828 Personal history of other malignant neoplasm of skin: Secondary | ICD-10-CM | POA: Insufficient documentation

## 2017-05-14 DIAGNOSIS — Z23 Encounter for immunization: Secondary | ICD-10-CM | POA: Diagnosis not present

## 2017-05-14 DIAGNOSIS — Z79899 Other long term (current) drug therapy: Secondary | ICD-10-CM | POA: Diagnosis not present

## 2017-05-14 DIAGNOSIS — Y999 Unspecified external cause status: Secondary | ICD-10-CM | POA: Insufficient documentation

## 2017-05-14 DIAGNOSIS — Y92014 Private driveway to single-family (private) house as the place of occurrence of the external cause: Secondary | ICD-10-CM | POA: Insufficient documentation

## 2017-05-14 LAB — CBC WITH DIFFERENTIAL/PLATELET
BASOS ABS: 0 10*3/uL (ref 0.0–0.1)
BASOS PCT: 0 %
EOS PCT: 0 %
Eosinophils Absolute: 0 10*3/uL (ref 0.0–0.7)
HEMATOCRIT: 42.2 % (ref 36.0–46.0)
Hemoglobin: 14.3 g/dL (ref 12.0–15.0)
LYMPHS PCT: 12 %
Lymphs Abs: 1.3 10*3/uL (ref 0.7–4.0)
MCH: 32.7 pg (ref 26.0–34.0)
MCHC: 33.9 g/dL (ref 30.0–36.0)
MCV: 96.6 fL (ref 78.0–100.0)
MONO ABS: 0.6 10*3/uL (ref 0.1–1.0)
Monocytes Relative: 6 %
NEUTROS ABS: 8.4 10*3/uL — AB (ref 1.7–7.7)
Neutrophils Relative %: 82 %
PLATELETS: 282 10*3/uL (ref 150–400)
RBC: 4.37 MIL/uL (ref 3.87–5.11)
RDW: 12.5 % (ref 11.5–15.5)
WBC: 10.4 10*3/uL (ref 4.0–10.5)

## 2017-05-14 LAB — BASIC METABOLIC PANEL
ANION GAP: 8 (ref 5–15)
BUN: 15 mg/dL (ref 6–20)
CALCIUM: 9.1 mg/dL (ref 8.9–10.3)
CO2: 25 mmol/L (ref 22–32)
Chloride: 107 mmol/L (ref 101–111)
Creatinine, Ser: 0.76 mg/dL (ref 0.44–1.00)
GLUCOSE: 113 mg/dL — AB (ref 65–99)
POTASSIUM: 6.1 mmol/L — AB (ref 3.5–5.1)
Sodium: 140 mmol/L (ref 135–145)

## 2017-05-14 MED ORDER — PROPOFOL 10 MG/ML IV BOLUS
INTRAVENOUS | Status: AC
  Administered 2017-05-14: 30 mg
  Filled 2017-05-14: qty 20

## 2017-05-14 MED ORDER — BACITRACIN ZINC 500 UNIT/GM EX OINT
TOPICAL_OINTMENT | CUTANEOUS | Status: AC
  Administered 2017-05-14: 2
  Filled 2017-05-14: qty 0.9

## 2017-05-14 MED ORDER — MORPHINE SULFATE (PF) 4 MG/ML IV SOLN
4.0000 mg | Freq: Once | INTRAVENOUS | Status: AC
  Administered 2017-05-14: 4 mg via INTRAVENOUS
  Filled 2017-05-14: qty 1

## 2017-05-14 MED ORDER — ONDANSETRON HCL 4 MG/2ML IJ SOLN
4.0000 mg | Freq: Once | INTRAMUSCULAR | Status: AC
  Administered 2017-05-14: 4 mg via INTRAVENOUS
  Filled 2017-05-14: qty 2

## 2017-05-14 MED ORDER — HYDROCODONE-ACETAMINOPHEN 5-325 MG PO TABS: 1.0000 | ORAL_TABLET | ORAL | 0 refills | Status: DC | PRN

## 2017-05-14 MED ORDER — PROPOFOL 10 MG/ML IV BOLUS
INTRAVENOUS | Status: AC | PRN
  Administered 2017-05-14: 30 mg via INTRAVENOUS
  Administered 2017-05-14: 40 mg via INTRAVENOUS

## 2017-05-14 MED ORDER — TETANUS-DIPHTHERIA TOXOIDS TD 5-2 LFU IM INJ
0.5000 mL | INJECTION | Freq: Once | INTRAMUSCULAR | Status: AC
  Administered 2017-05-14: 0.5 mL via INTRAMUSCULAR
  Filled 2017-05-14: qty 0.5

## 2017-05-14 NOTE — ED Notes (Signed)
Bed: WA09 Expected date:  Expected time:  Means of arrival:  Comments: EMS-hip pain

## 2017-05-14 NOTE — ED Triage Notes (Signed)
Pt received 1 L of fluid, 4 zofran, 150 fentanyl
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
## 2017-05-14 NOTE — Sedation Documentation (Signed)
Pt apneic. Oxygen applied.

## 2017-05-14 NOTE — ED Notes (Signed)
Attempted to call ortho tech x3

## 2017-05-14 NOTE — Sedation Documentation (Signed)
Patient is resting comfortably. 

## 2017-06-17 ENCOUNTER — Emergency Department (HOSPITAL_COMMUNITY): Payer: 59

## 2017-06-17 ENCOUNTER — Emergency Department (HOSPITAL_COMMUNITY)
Admission: EM | Admit: 2017-06-17 | Discharge: 2017-06-17 | Disposition: A | Discharge status: Home / Self Care | Payer: 59 | Attending: Emergency Medicine | Admitting: Emergency Medicine

## 2017-06-17 DIAGNOSIS — T84020A Dislocation of internal right hip prosthesis, initial encounter: Secondary | ICD-10-CM | POA: Diagnosis not present

## 2017-06-17 DIAGNOSIS — S73004A Unspecified dislocation of right hip, initial encounter: Secondary | ICD-10-CM

## 2017-06-17 DIAGNOSIS — Y658 Other specified misadventures during surgical and medical care: Secondary | ICD-10-CM | POA: Insufficient documentation

## 2017-06-17 DIAGNOSIS — Q7291 Unspecified reduction defect of right lower limb: Secondary | ICD-10-CM

## 2017-06-17 MED ORDER — MORPHINE SULFATE (PF) 4 MG/ML IV SOLN
6.0000 mg | Freq: Once | INTRAVENOUS | Status: AC
  Administered 2017-06-17: 6 mg via INTRAVENOUS
  Filled 2017-06-17: qty 2

## 2017-06-17 MED ORDER — ONDANSETRON 4 MG PO TBDP: 4.0000 mg | ORAL_TABLET | Freq: Three times a day (TID) | ORAL | 0 refills | Status: DC | PRN

## 2017-06-17 MED ORDER — ONDANSETRON HCL 4 MG/2ML IJ SOLN
4.0000 mg | Freq: Once | INTRAMUSCULAR | Status: AC
  Administered 2017-06-17: 4 mg via INTRAVENOUS
  Filled 2017-06-17: qty 2

## 2017-06-17 MED ORDER — HYDROCODONE-ACETAMINOPHEN 5-325 MG PO TABS: 1.0000 | ORAL_TABLET | ORAL | 0 refills | Status: AC | PRN

## 2017-06-17 MED ORDER — PROPOFOL 10 MG/ML IV BOLUS
INTRAVENOUS | Status: AC | PRN
  Administered 2017-06-17 (×3): 10 mg via INTRAVENOUS

## 2017-06-17 MED ORDER — SODIUM CHLORIDE 0.9 % IV SOLN
8.0000 mg | Freq: Once | INTRAVENOUS | Status: DC
  Filled 2017-06-17: qty 4

## 2017-06-17 MED ORDER — KETAMINE HCL 50 MG/5ML IJ SOSY
0.5000 mg/kg | PREFILLED_SYRINGE | Freq: Once | INTRAMUSCULAR | Status: AC
  Administered 2017-06-17: 31 mg via INTRAVENOUS
  Filled 2017-06-17: qty 5

## 2017-06-17 MED ORDER — PROPOFOL 10 MG/ML IV BOLUS
0.5000 mg/kg | Freq: Once | INTRAVENOUS | Status: AC
  Administered 2017-06-17: 30.6 mg via INTRAVENOUS
  Filled 2017-06-17: qty 20
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
## 2017-06-17 NOTE — Discharge Instructions (Addendum)
Knee immobilizer at all times

## 2017-06-17 NOTE — ED Notes (Signed)
Bed: PB35 Expected date:  Expected time:  Means of arrival:  Comments: EMS/sidlocated hip

## 2017-10-14 ENCOUNTER — Other Ambulatory Visit: Payer: Self-pay

## 2017-10-14 ENCOUNTER — Ambulatory Visit: Payer: 59 | Admitting: Family Medicine

## 2017-10-14 ENCOUNTER — Encounter: Payer: Self-pay | Admitting: Family Medicine

## 2017-10-14 VITALS — BP 122/74 | HR 70 | Temp 98.7°F | Ht 64.0 in | Wt 134.2 lb

## 2017-10-14 DIAGNOSIS — H9201 Otalgia, right ear: Secondary | ICD-10-CM | POA: Diagnosis not present

## 2017-10-16 NOTE — Progress Notes (Signed)
    CHIEF COMPLAINT / HPI:  right ear pain x 3-4 days. Aching. No hearing problems. Allergy smptoms present. Taking allergy meds regularly.  REVIEW OF SYSTEMS: No fever  PERTINENT  PMH / PSH: I have reviewed the patient's medications, allergies, past medical and surgical history, smoking status and updated in the EMR as appropriate.   OBJECTIVE:  GEN WD WN NAD HEENT: TM on right is mildy retracted but otherwise normal. Left TM is normal TMJ on right painful with palpation, there is a click with open and closing but no specific pain with click or opening/closing.  OP normal with no exudate and MMM.EOMI PERRLA NECK no LAD, no TM, no JVD  ASSESSMENT / PLAN:  Ear and or jaw pain--mild but peristent for 43 days. Maybe component of TMJ as well as eustachian tube dysfunction. Add mild OTC decongestant and OTC NSAID. Call if not improving in 2-3 days.

## 2017-12-02 ENCOUNTER — Other Ambulatory Visit: Payer: Self-pay | Admitting: Family Medicine

## 2017-12-30 ENCOUNTER — Encounter: Payer: Self-pay | Admitting: Family Medicine

## 2017-12-30 ENCOUNTER — Ambulatory Visit: Payer: Medicare Other | Admitting: Family Medicine

## 2017-12-30 ENCOUNTER — Other Ambulatory Visit: Payer: Self-pay

## 2017-12-30 DIAGNOSIS — Z0000 Encounter for general adult medical examination without abnormal findings: Secondary | ICD-10-CM | POA: Diagnosis not present

## 2017-12-30 MED ORDER — HYDROCODONE-ACETAMINOPHEN 5-325 MG PO TABS: 1.0000 | ORAL_TABLET | Freq: Four times a day (QID) | ORAL | 0 refills | Status: AC | PRN

## 2017-12-31 NOTE — Progress Notes (Signed)
CHIEF COMPLAINT / HPI: Here for checkup.  Needs some refills.  Very infrequently uses some Ambien to help her sleep.  Also occasionally uses half of a 1 tablet of hydrocodone for low back pain, using about 3 tablets a month.  Occasionally will have anxiety during the day related to stressors such as her husband's health etc.  Uses about 5-10 Xanax a year for this.  He needs refills on these.  She is retiring.  Very happy about that.  Intermittent back pain, once or twice a month.  Low back.  Does not radiate below the buttock area.  No bowel or bladder incontinence.  No lower extremity weakness.  Has been stable for years with no increase in symptoms in frequency or intensity.  Insomnia maybe once or twice a month.  Finds herself just ruminating on issues and cannot sleep which makes her more anxious.  Has used one half of an Ambien tablet with good benefit for years.  Occasionally she will have anxiety symptoms during the day, this occurs about once a month.  One half of a tablet of Xanax at these times with success.  He has never had any episodes that lasted longer than 1 day.  Anxiety is usually related to situational stressors.  She says "I can get myself quite worked up".  REVIEW OF SYSTEMS: Review of Systems  Constitutional: Negative for activity chang; no  appetite change and no unexpected weight change.  Eyes: Negative for eye pain and no visual disturbance.  Neck: denies neck pain; no swallowing problems CV: No chest pain, no shortness of breath, no lower extremity edema. No change in exercise tolerance Respiratory: Negative for cough or wheezing.  No shortness of breath. Gastrointestinal: Negative for abdominal pain, no diarrhea and no  constipation.  Genitourinary: Negative for decreased urine volume and  no difficulty urinating.  Musculoskeletal: see hpi:  No muscle weakness. Skin: Negative for rash.  Psychiatric/Behavioral: Negative for behavioral problems; no sleep disturbance  and no  agitation.     PERTINENT  PMH / PSH: I have reviewed the patient's medications, allergies, past medical and surgical history, smoking status and updated in the EMR as appropriate. Bilateral total hip replacement with subsequent revision on the right after she dislocated prosthesis.  Quite a bit of physical therapy for that, is no longer able to cross her legs, has some intermittent occasional pain for this. History of squamous cell skin cancer on the chest which was excised with negative margins.  She sees dermatology twice a year for this and has complete skin exam. OBJECTIVE: Vital signs reviewed GENERALl: Well developed, well nourished, in no acute distress. HEENT: PERRLA, EOMI, sclerae are nonicteric NECK: Supple, FROM, without lymphadenopathy.  TMs bilaterally have some mild retraction small amount of cerumen in the left external auditory canal but it is nonobstructive. THYROID: normal without nodularity CAROTID ARTERIES: without bruits LUNGS: clear to auscultation bilaterally. No wheezes or rales. Normal respiratory effort HEART: Regular rate and rhythm, no murmurs. Distal pulses are bilaterally symmetrical, 2+. ABDOMEN: soft with positive bowel sounds. No masses noted MSK: MOE x 4. Normal muscle strength, bulk and tone. SKIN no rash. Normal temperature. NEURO: no focal deficits. Normal gait. Normal balance. PSYCH: AxOx4. Good eye contact.. No psychomotor retardation or agitation. Appropriate speech fluency and content. Asks and answers questions appropriately. Mood is congruent.    ASSESSMENT / PLAN:  Well adult exam Quite healthy.  She exercises regularly.  Takes vitamin D and gets appropriate calcium.  We discussed possibly doing a DEXA scan.  She had one a couple of years ago but does not think she would be inclined to do bisphosphonate therapy even if she had osteoporosis.  Right now she would like to put that off.  She will come back and get her pneumonia shot.

## 2017-12-31 NOTE — Assessment & Plan Note (Signed)
Quite healthy.  She exercises regularly.  Takes vitamin D and gets appropriate calcium.  We discussed possibly doing a DEXA scan.  She had one a couple of years ago but does not think she would be inclined to do bisphosphonate therapy even if she had osteoporosis.  Right now she would like to put that off.  She will come back and get her pneumonia shot.

## 2018-01-15 ENCOUNTER — Telehealth: Payer: Self-pay

## 2018-01-15 ENCOUNTER — Ambulatory Visit (INDEPENDENT_AMBULATORY_CARE_PROVIDER_SITE_OTHER): Payer: Medicare Other

## 2018-01-15 DIAGNOSIS — Z23 Encounter for immunization: Secondary | ICD-10-CM

## 2018-01-15 MED ORDER — ALPRAZOLAM 0.25 MG PO TABS: 0.2500 mg | ORAL_TABLET | Freq: Every day | ORAL | 0 refills | Status: DC | PRN

## 2018-01-15 NOTE — Telephone Encounter (Signed)
Patient in to nurse clinic for Pneumovax. Requested message be sent to PCP to let her know she would like to have Rx for Alprazolam. Previous strength was 0.25 mg and she took it PRN. Please send to [facility]. Danley Danker, RN [facility] RN)

## 2018-01-15 NOTE — Progress Notes (Signed)
   Patient in to nurse clinic for pneumococcal vaccine. Pneumovax given right deltoid. Patient tolerated well. Danley Danker, RN [facility] RN)

## 2018-02-03 ENCOUNTER — Encounter: Payer: Self-pay | Admitting: Family Medicine

## 2018-02-04 ENCOUNTER — Encounter: Payer: Self-pay | Admitting: Family Medicine

## 2018-06-09 ENCOUNTER — Encounter: Payer: Self-pay | Admitting: Family Medicine

## 2018-06-15 ENCOUNTER — Other Ambulatory Visit: Payer: Self-pay | Admitting: Family Medicine

## 2018-06-15 MED ORDER — RAMELTEON 8 MG PO TABS: 8.0000 mg | ORAL_TABLET | Freq: Every day | ORAL | 3 refills | Status: DC

## 2018-06-15 MED ORDER — CYCLOBENZAPRINE HCL 10 MG PO TABS: ORAL_TABLET | ORAL | 3 refills | Status: DC

## 2018-06-24 ENCOUNTER — Encounter: Payer: Self-pay | Admitting: Family Medicine

## 2018-06-30 MED ORDER — CYCLOBENZAPRINE HCL 10 MG PO TABS: ORAL_TABLET | ORAL | 3 refills | Status: DC

## 2018-07-01 ENCOUNTER — Telehealth: Payer: Self-pay | Admitting: *Deleted

## 2018-07-01 NOTE — Telephone Encounter (Signed)
Received fax from pharmacy, PA needed on Flexeril.  Clinical questions submitted via Cover My Meds.  Waiting on response, could take up to 72 hours.  Cover My Meds info: Key:  [id number]  Christen Bame, CMA

## 2018-07-02 NOTE — Telephone Encounter (Signed)
Med denied, see below:     Christen Bame, CMA

## 2018-07-05 MED ORDER — TIZANIDINE HCL 4 MG PO CAPS: 4.0000 mg | ORAL_CAPSULE | Freq: Every evening | ORAL | 2 refills | Status: DC | PRN

## 2018-07-05 NOTE — Telephone Encounter (Signed)
Dear [name] Team Please let her know that the cyclobenzaprine was deined by her insurance. Their formulary states you must have tried tizanadine which I have ordered instead. Itis a similar med. Hopefullythis will go through [facility]! [name]

## 2018-07-05 NOTE — Addendum Note (Signed)
Addended byDickie La on: 07/05/2018 09:22 AM   Modules accepted: Orders

## 2018-07-07 NOTE — Telephone Encounter (Signed)
Pt informed of below.[name], CMA ? ?

## 2018-08-23 ENCOUNTER — Other Ambulatory Visit: Payer: Self-pay | Admitting: Family Medicine

## 2018-08-23 ENCOUNTER — Encounter: Payer: Self-pay | Admitting: Family Medicine

## 2018-08-24 ENCOUNTER — Telehealth: Payer: Self-pay

## 2018-08-24 NOTE — Telephone Encounter (Signed)
Received fax from pharmacy requesting a prior auth for Zolpidem Tartrate 5 mg Tabs. PA sent to plan via cover my meds. Will await response.  Key: [id number]

## 2018-09-29 ENCOUNTER — Ambulatory Visit: Payer: Medicare Other

## 2018-09-29 ENCOUNTER — Encounter: Payer: Self-pay | Admitting: Family Medicine

## 2018-10-01 ENCOUNTER — Ambulatory Visit (INDEPENDENT_AMBULATORY_CARE_PROVIDER_SITE_OTHER): Payer: Medicare Other | Admitting: *Deleted

## 2018-10-01 ENCOUNTER — Other Ambulatory Visit: Payer: Self-pay

## 2018-10-01 DIAGNOSIS — Z23 Encounter for immunization: Secondary | ICD-10-CM

## 2018-11-19 ENCOUNTER — Other Ambulatory Visit: Payer: Self-pay

## 2018-11-19 DIAGNOSIS — Z20822 Contact with and (suspected) exposure to covid-19: Secondary | ICD-10-CM

## 2018-11-20 LAB — NOVEL CORONAVIRUS, NAA: SARS-CoV-2, NAA: NOT DETECTED

## 2019-01-05 ENCOUNTER — Encounter: Payer: Medicare Other | Admitting: Family Medicine

## 2019-02-04 ENCOUNTER — Ambulatory Visit: Payer: Medicare Other | Attending: Internal Medicine

## 2019-02-04 ENCOUNTER — Other Ambulatory Visit: Payer: Self-pay

## 2019-02-04 DIAGNOSIS — Z23 Encounter for immunization: Secondary | ICD-10-CM | POA: Insufficient documentation

## 2019-02-04 NOTE — Progress Notes (Signed)
   Covid-19 Vaccination Clinic  Name:  [name]    MRN: [id number] DOB: 08-Mar-1950  02/04/2019  [name] was observed post Covid-19 immunization for 15 minutes without incidence. She was provided with Vaccine Information Sheet and instruction to access the V-Safe system.   [name] was instructed to call 911 with any severe reactions post vaccine: Marland Kitchen Difficulty breathing  . Swelling of your face and throat  . A fast heartbeat  . A bad rash all over your body  . Dizziness and weakness    Immunizations Administered    Name Date Dose VIS Date Route   Pfizer COVID-19 Vaccine 02/04/2019 10:23 AM 0.3 mL 12/24/2018 Intramuscular   Manufacturer: [place]   Lot: [id number]   [place]: [id number]

## 2019-02-14 ENCOUNTER — Ambulatory Visit: Payer: Medicare Other

## 2019-02-24 ENCOUNTER — Ambulatory Visit: Payer: Medicare Other | Attending: Internal Medicine

## 2019-02-24 DIAGNOSIS — Z23 Encounter for immunization: Secondary | ICD-10-CM | POA: Insufficient documentation

## 2019-03-07 ENCOUNTER — Other Ambulatory Visit: Payer: Self-pay

## 2019-03-07 ENCOUNTER — Ambulatory Visit (INDEPENDENT_AMBULATORY_CARE_PROVIDER_SITE_OTHER): Payer: Medicare Other | Admitting: Family Medicine

## 2019-03-07 ENCOUNTER — Encounter: Payer: Self-pay | Admitting: Family Medicine

## 2019-03-07 VITALS — BP 124/80 | HR 90 | Ht 64.0 in | Wt 132.5 lb

## 2019-03-07 DIAGNOSIS — H6123 Impacted cerumen, bilateral: Secondary | ICD-10-CM | POA: Insufficient documentation

## 2019-03-07 MED ORDER — DEBROX 6.5 % OT SOLN: 10.0000 [drp] | Freq: Two times a day (BID) | OTIC | 0 refills | Status: DC

## 2019-03-07 NOTE — Assessment & Plan Note (Signed)
Bilateral cerumen impaction.  Improved after declotting procedure. -Prescribe Debrox

## 2019-03-07 NOTE — Patient Instructions (Signed)
Earwax Buildup, Adult The ears produce a substance called earwax that helps keep bacteria out of the ear and protects the skin in the ear canal. Occasionally, earwax can build up in the ear and cause discomfort or hearing loss. What increases the risk? This condition is more likely to develop in people who:  Are female.  Are elderly.  Naturally produce more earwax.  Clean their ears often with cotton swabs.  Use earplugs often.  Use in-ear headphones often.  Wear hearing aids.  Have narrow ear canals.  Have earwax that is overly thick or sticky.  Have eczema.  Are dehydrated.  Have excess hair in the ear canal. What are the signs or symptoms? Symptoms of this condition include:  Reduced or muffled hearing.  A feeling of fullness in the ear or feeling that the ear is plugged.  Fluid coming from the ear.  Ear pain.  Ear itch.  Ringing in the ear.  Coughing.  An obvious piece of earwax that can be seen inside the ear canal. How is this diagnosed? This condition may be diagnosed based on:  Your symptoms.  Your medical history.  An ear exam. During the exam, your health care provider will look into your ear with an instrument called an otoscope. You may have tests, including a hearing test. How is this treated? This condition may be treated by:  Using ear drops to soften the earwax.  Having the earwax removed by a health care provider. The health care provider may: ? Flush the ear with water. ? Use an instrument that has a loop on the end (curette). ? Use a suction device.  Surgery to remove the wax buildup. This may be done in severe cases. Follow these instructions at home:   Take over-the-counter and prescription medicines only as told by your health care provider.  Do not put any objects, including cotton swabs, into your ear. You can clean the opening of your ear canal with a washcloth or facial tissue.  Follow instructions from your health care  provider about cleaning your ears. Do not over-clean your ears.  Drink enough fluid to keep your urine clear or pale yellow. This will help to thin the earwax.  Keep all follow-up visits as told by your health care provider. If earwax builds up in your ears often or if you use hearing aids, consider seeing your health care provider for routine, preventive ear cleanings. Ask your health care provider how often you should schedule your cleanings.  If you have hearing aids, clean them according to instructions from the manufacturer and your health care provider. Contact a health care provider if:  You have ear pain.  You develop a fever.  You have blood, pus, or other fluid coming from your ear.  You have hearing loss.  You have ringing in your ears that does not go away.  Your symptoms do not improve with treatment.  You feel like the room is spinning (vertigo). Summary  Earwax can build up in the ear and cause discomfort or hearing loss.  The most common symptoms of this condition include reduced or muffled hearing and a feeling of fullness in the ear or feeling that the ear is plugged.  This condition may be diagnosed based on your symptoms, your medical history, and an ear exam.  This condition may be treated by using ear drops to soften the earwax or by having the earwax removed by a health care provider.  Do not put any   objects, including cotton swabs, into your ear. You can clean the opening of your ear canal with a washcloth or facial tissue. This information is not intended to replace advice given to you by your health care provider. Make sure you discuss any questions you have with your health care provider. Document Revised: 12/12/2016 Document Reviewed: 03/12/2016 Elsevier Patient Education  2020 Elsevier Inc.  

## 2019-03-07 NOTE — Progress Notes (Signed)
    SUBJECTIVE:   CHIEF COMPLAINT / HPI:   Decreased hearing in her left ear.  Feels like it is clogged.  Tried mineral oil at home to remove wax as she has history of this but did not improve.  Denies any headaches, right-sided symptoms, tinnitus, recent URI type symptoms.  PERTINENT  PMH / PSH:  Noncontributory OBJECTIVE:   BP 124/80   Pulse 90   Ht 5\' 4"  (1.626 m)   Wt 132 lb 8 oz (60.1 kg)   SpO2 99%   BMI 22.74 kg/m   General: Well-appearing, no acute distress HEENT: Moist membranes, bilateral cerumen impaction, EOMI, PERRLA, no oropharyngeal lesions  ASSESSMENT/PLAN:   Bilateral impacted cerumen Bilateral cerumen impaction.  Improved after declotting procedure. -Prescribe Debrox     Bonnita Hollow, MD [place]

## 2019-03-22 ENCOUNTER — Encounter: Payer: Self-pay | Admitting: Family Medicine

## 2019-05-25 ENCOUNTER — Other Ambulatory Visit: Payer: Self-pay

## 2019-05-25 ENCOUNTER — Ambulatory Visit: Payer: Medicare Other | Admitting: Family Medicine

## 2019-05-25 ENCOUNTER — Encounter: Payer: Self-pay | Admitting: Family Medicine

## 2019-05-25 ENCOUNTER — Telehealth: Payer: Self-pay | Admitting: *Deleted

## 2019-05-25 VITALS — BP 120/84 | HR 56 | Ht 64.0 in | Wt 130.4 lb

## 2019-05-25 DIAGNOSIS — G4709 Other insomnia: Secondary | ICD-10-CM

## 2019-05-25 DIAGNOSIS — M160 Bilateral primary osteoarthritis of hip: Secondary | ICD-10-CM

## 2019-05-25 DIAGNOSIS — Z23 Encounter for immunization: Secondary | ICD-10-CM

## 2019-05-25 DIAGNOSIS — Z0000 Encounter for general adult medical examination without abnormal findings: Secondary | ICD-10-CM

## 2019-05-25 DIAGNOSIS — M8580 Other specified disorders of bone density and structure, unspecified site: Secondary | ICD-10-CM

## 2019-05-25 MED ORDER — ZOLPIDEM TARTRATE 5 MG PO TABS: ORAL_TABLET | ORAL | 2 refills | Status: DC

## 2019-05-25 MED ORDER — TRAMADOL HCL 50 MG PO TABS: 100.0000 mg | ORAL_TABLET | Freq: Every evening | ORAL | 2 refills | Status: DC | PRN

## 2019-05-25 MED ORDER — ALPRAZOLAM 0.25 MG PO TABS: 0.2500 mg | ORAL_TABLET | Freq: Every day | ORAL | 1 refills | Status: DC | PRN

## 2019-05-25 MED ORDER — TRAMADOL HCL 50 MG PO TABS: ORAL_TABLET | ORAL | 2 refills | Status: DC

## 2019-05-25 MED ORDER — CYCLOBENZAPRINE HCL 10 MG PO TABS: ORAL_TABLET | ORAL | 3 refills | Status: DC

## 2019-05-25 NOTE — Patient Instructions (Signed)
Great to  See you! I will send you a note about your labs.

## 2019-05-25 NOTE — Telephone Encounter (Signed)
Received fax from pharmacy, PA needed on cyclobenzapine.  Clinical questions submitted via Cover My Meds.  Waiting on response, could take up to 72 hours.  Cover My Meds info: Key: [id number]  Christen Bame, CMA

## 2019-05-26 ENCOUNTER — Telehealth: Payer: Self-pay

## 2019-05-26 LAB — COMPREHENSIVE METABOLIC PANEL
ALT: 14 IU/L (ref 0–32)
AST: 19 IU/L (ref 0–40)
Albumin/Globulin Ratio: 1.8 (ref 1.2–2.2)
Albumin: 4.3 g/dL (ref 3.8–4.8)
Alkaline Phosphatase: 84 IU/L (ref 39–117)
BUN/Creatinine Ratio: 15 (ref 12–28)
BUN: 12 mg/dL (ref 8–27)
Bilirubin Total: 0.6 mg/dL (ref 0.0–1.2)
CO2: 25 mmol/L (ref 20–29)
Calcium: 9.9 mg/dL (ref 8.7–10.3)
Chloride: 104 mmol/L (ref 96–106)
Creatinine, Ser: 0.79 mg/dL (ref 0.57–1.00)
GFR calc Af Amer: 90 mL/min/{1.73_m2} (ref >59)
GFR calc non Af Amer: 78 mL/min/{1.73_m2} (ref >59)
Globulin, Total: 2.4 g/dL (ref 1.5–4.5)
Glucose: 88 mg/dL (ref 65–99)
Potassium: 4.9 mmol/L (ref 3.5–5.2)
Sodium: 144 mmol/L (ref 134–144)
Total Protein: 6.7 g/dL (ref 6.0–8.5)

## 2019-05-26 NOTE — Telephone Encounter (Signed)
Danae Orleans from pharmacy calls nurse line regarding patient rx for alprazolam and tramadol. Pharmacist states that there are interactions between these medication and she needs clarification before filling medications.   To PCP  Please advise  Talbot Grumbling, RN

## 2019-05-27 NOTE — Assessment & Plan Note (Signed)
We will try some tramadol for intermittent use for her hip pain.

## 2019-05-27 NOTE — Telephone Encounter (Signed)
Med denied.  See below:

## 2019-05-27 NOTE — Telephone Encounter (Signed)
Informed pt pharmacy of the below information.[name], CMA

## 2019-05-27 NOTE — Telephone Encounter (Signed)
Dear Dema Severin Team Please tell the pharmacist both the patient and I are aware. She uses alprazolam very rarely (social and flying anxiety) and also the tramdol is very rare for low back pain. Maybe add to sig for loreazepam: do not use within 8 hours of loreazepam. Please tell them I appreciate the call. THANKS! [name]

## 2019-05-27 NOTE — Progress Notes (Signed)
    CHIEF COMPLAINT / HPI:   Health check.  She has had both her Covid shots. 2.  Needs refills on Ambien which she uses very intermittently for sleep insomnia.  Also needs refill on Xanax which she also uses very infrequently for situational anxiety and flying anxiety. 3.  Chronic low back pain.  Typically does well with very intermittent muscle relaxer.  We had tried her on some hydrocodone but she felt that was very much too strong for her.  She would like to try something any immediate.  She has used 1 or 2 of her husband's tramadol in the past and they seem to work well without side effects.  PERTINENT  PMH / PSH: I have reviewed the patient's medications, allergies, past medical and surgical history, smoking status and updated in the EMR as appropriate.  She has dermatologist she does complete skin exam twice a year OBJECTIVE:  BP 120/84   Pulse (!) 56   Ht 5\' 4"  (1.626 m)   Wt 130 lb 6.4 oz (59.1 kg)   SpO2 100%   BMI 22.38 kg/m   Vital signs reviewed. GENERAL: Well-developed, well-nourished, no acute distress. CARDIOVASCULAR: Regular rate and rhythm no murmur gallop or rub LUNGS: Clear to auscultation bilaterally, no rales or wheeze. ABDOMEN: Soft positive bowel sounds NEURO: No gross focal neurological deficits. MSK: Movement of extremity x 4.   ASSESSMENT / PLAN:   Well adult exam I recommend we get a DEXA scan and we have set that up.  I refilled her medicines.  OA (osteoarthritis) of hip We will try some tramadol for intermittent use for her hip pain.   [name] MD

## 2019-05-27 NOTE — Assessment & Plan Note (Signed)
I recommend we get a DEXA scan and we have set that up.  I refilled her medicines.

## 2019-06-04 ENCOUNTER — Encounter: Payer: Self-pay | Admitting: Family Medicine

## 2019-06-04 NOTE — Telephone Encounter (Signed)
Regarding the prior authorization, please tell them none of the medications they listed are muscle relaxers.  All of the ones they listed are NSAIDs.  She cannot take NSAIDs.  Please tell them the specific reason she needs this is that she has taken it intermittently for years, since 2018, for low back spasm and it has worked well.  If you can send that in it would be great.  If they still do not approve it, please let her know that I think she can get it relatively cheap at the outpatient pharmacy or [place].  This is ridiculous that we have to go through this for simple medicine that has worked for in the past.  Thanks for your attention to this.  I am out of the office but I will be checking my in basket.

## 2019-08-08 ENCOUNTER — Other Ambulatory Visit: Payer: Medicare Other

## 2019-11-04 ENCOUNTER — Ambulatory Visit
Admission: RE | Admit: 2019-11-04 | Discharge: 2019-11-04 | Disposition: A | Discharge status: Home / Self Care | Payer: Medicare Other | Source: Ambulatory Visit | Attending: Family Medicine | Admitting: Family Medicine

## 2019-11-04 ENCOUNTER — Other Ambulatory Visit: Payer: Self-pay

## 2019-11-04 DIAGNOSIS — M8580 Other specified disorders of bone density and structure, unspecified site: Secondary | ICD-10-CM

## 2019-11-04 DIAGNOSIS — Z0000 Encounter for general adult medical examination without abnormal findings: Secondary | ICD-10-CM

## 2019-11-08 ENCOUNTER — Encounter: Payer: Self-pay | Admitting: Family Medicine

## 2019-11-08 NOTE — Progress Notes (Signed)
Ls If she wishes to discuss medication, she is to make appt

## 2019-11-09 ENCOUNTER — Encounter: Payer: Self-pay | Admitting: Family Medicine

## 2020-01-14 HISTORY — PX: EYE SURGERY: SHX253

## 2020-04-09 ENCOUNTER — Encounter: Payer: Self-pay | Admitting: Family Medicine

## 2020-04-25 ENCOUNTER — Ambulatory Visit: Payer: Medicare Other | Admitting: Family Medicine

## 2020-04-25 ENCOUNTER — Encounter: Payer: Self-pay | Admitting: Family Medicine

## 2020-04-25 ENCOUNTER — Other Ambulatory Visit: Payer: Self-pay

## 2020-04-25 VITALS — BP 118/76 | HR 71 | Ht 64.0 in | Wt 134.6 lb

## 2020-04-25 DIAGNOSIS — M810 Age-related osteoporosis without current pathological fracture: Secondary | ICD-10-CM

## 2020-04-25 DIAGNOSIS — Z0000 Encounter for general adult medical examination without abnormal findings: Secondary | ICD-10-CM

## 2020-04-25 DIAGNOSIS — H409 Unspecified glaucoma: Secondary | ICD-10-CM | POA: Insufficient documentation

## 2020-04-25 DIAGNOSIS — D045 Carcinoma in situ of skin of trunk: Secondary | ICD-10-CM

## 2020-04-25 DIAGNOSIS — Z96643 Presence of artificial hip joint, bilateral: Secondary | ICD-10-CM

## 2020-04-25 DIAGNOSIS — H4010X0 Unspecified open-angle glaucoma, stage unspecified: Secondary | ICD-10-CM

## 2020-04-25 DIAGNOSIS — H6123 Impacted cerumen, bilateral: Secondary | ICD-10-CM | POA: Diagnosis not present

## 2020-04-25 DIAGNOSIS — Z96649 Presence of unspecified artificial hip joint: Secondary | ICD-10-CM | POA: Insufficient documentation

## 2020-04-25 MED ORDER — ALPRAZOLAM 0.25 MG PO TABS: 0.2500 mg | ORAL_TABLET | Freq: Every day | ORAL | 3 refills | Status: DC | PRN

## 2020-04-25 MED ORDER — ZOLPIDEM TARTRATE 5 MG PO TABS: ORAL_TABLET | ORAL | 2 refills | Status: DC

## 2020-04-25 MED ORDER — BACLOFEN 10 MG PO TABS: ORAL_TABLET | ORAL | 3 refills | Status: DC

## 2020-04-25 MED ORDER — TRAMADOL HCL 50 MG PO TABS: ORAL_TABLET | ORAL | 2 refills | Status: DC

## 2020-04-25 NOTE — Progress Notes (Signed)
    CHIEF COMPLAINT / HPI:  Check up 1. Discussion re bone density scan 2. Back spasms intermittent, unchanged. Liorisil   works best. 2 or 3 per month 3 insomnia---ambien still useful about 2-3 times a month for insomnia. Difficulty getting to sleep as well as maintaining. 4. Anxiety, especially with travel and in crowded public places better with prn a;prazolam. Uses very sparingly 5. No hip pains---no more problems. Has modified daily activities to prottect from recurrent dislocation   PERTINENT  PMH / PSH: I have reviewed the patient's medications, allergies, past medical and surgical history, smoking status and updated in the EMR as appropriate.   OBJECTIVE:  BP 118/76   Pulse 71   Ht 5\' 4"  (1.626 m)   Wt 134 lb 9.6 oz (61.1 kg)   SpO2 99%   BMI 23.10 kg/m  Vital signs reviewed. GENERAL: Well-developed, well-nourished, no acute distress. CARDIOVASCULAR: Regular rate and rhythm no murmur gallop or rub LUNGS: Clear to auscultation bilaterally, no rales or wheeze. ABDOMEN: Soft positive bowel sounds NEURO: No gross focal neurological deficits. MSK: Movement of extremity x 4.    ASSESSMENT / PLAN:  Well adult health maintenance up to date Labs today: vitamin D level. 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

## 2020-04-25 NOTE — Patient Instructions (Signed)
I will send you a note about your labs. Great to see you! 

## 2020-04-25 NOTE — Assessment & Plan Note (Signed)
Discussed options She does not wish to start therapy at this time nor does she wish further consultation Will check vitamin D level

## 2020-04-25 NOTE — Assessment & Plan Note (Signed)
Doing well 

## 2020-04-25 NOTE — Assessment & Plan Note (Signed)
Better since she started swimming again (uses drops after swimming regularly0

## 2020-04-25 NOTE — Assessment & Plan Note (Signed)
Sees dermatologist regullarly

## 2020-04-26 LAB — VITAMIN D 25 HYDROXY (VIT D DEFICIENCY, FRACTURES): Vit D, 25-Hydroxy: 64.4 ng/mL (ref 30.0–100.0)

## 2020-04-30 ENCOUNTER — Encounter: Payer: Self-pay | Admitting: Family Medicine

## 2020-10-16 ENCOUNTER — Encounter: Payer: Self-pay | Admitting: Family Medicine

## 2021-03-06 ENCOUNTER — Other Ambulatory Visit: Payer: Self-pay

## 2021-03-06 ENCOUNTER — Ambulatory Visit (INDEPENDENT_AMBULATORY_CARE_PROVIDER_SITE_OTHER): Payer: Medicare Other

## 2021-03-06 DIAGNOSIS — Z23 Encounter for immunization: Secondary | ICD-10-CM

## 2021-03-07 NOTE — Progress Notes (Signed)
Patient presents to nurse clinic for Hep A and Hep B series. Patient is traveling to [place] and needs these vaccinations. Administered per Dr. Nori Riis.   See immunization flow sheet.   Provided patient with updated immunization record.   Talbot Grumbling, RN

## 2021-03-12 ENCOUNTER — Other Ambulatory Visit: Payer: Self-pay

## 2021-03-12 ENCOUNTER — Ambulatory Visit (INDEPENDENT_AMBULATORY_CARE_PROVIDER_SITE_OTHER): Payer: Medicare Other

## 2021-03-12 VITALS — Ht 64.0 in | Wt 134.0 lb

## 2021-03-12 DIAGNOSIS — Z0000 Encounter for general adult medical examination without abnormal findings: Secondary | ICD-10-CM

## 2021-03-12 NOTE — Progress Notes (Signed)
Subjective:   [name] is a 71 y.o. female who presents for Medicare Annual (Subsequent) preventive examination.  Patient consented to have virtual visit and was identified by name and date of birth. Method of visit: Telephone  Encounter participants: Patient: [name] - located at Home Nurse/Provider: Dorna Bloom - located at [facility]  Others (if applicable): NA  Review of Systems: Defer to PCP.  Cardiac Risk Factors include: advanced age (>33men, >25 women)  Objective:   Vitals: Ht 5\' 4"  (1.626 m)    Wt 134 lb (60.8 kg)    BMI 23.00 kg/m   Body mass index is 23 kg/m.  Advanced Directives 03/12/2021 04/25/2020 03/07/2019 12/30/2017 08/06/2016 08/06/2016 07/28/2016  Does Patient Have a Medical Advance Directive? Yes No No No Yes Yes Yes  Type of Paramedic of [place];Living will - - - [place];Living will [place];Living will [place];Living will  Does patient want to make changes to medical advance directive? No - Patient declined - - - No - Patient declined - No - Patient declined  Copy of [place] in Chart? Yes - validated most recent copy scanned in chart (See row information) - - - No - copy requested No - copy requested No - copy requested  Would patient like information on creating a medical advance directive? - No - Patient declined No - Patient declined No - Patient declined - - -  Pre-existing out of facility DNR order (yellow form or pink MOST form) - - - - - - -   Tobacco Social History   Tobacco Use  Smoking Status Never   Passive exposure: Never  Smokeless Tobacco Never     Clinical Intake:  Pre-visit preparation completed: Yes  Pain Score: 2   Diabetes: No  How often do you need to have someone help you when you read instructions, pamphlets, or other written materials from your doctor or pharmacy?: 1 - Never What is the last grade level you  completed in school?: Bachelors  Past Medical History:  Diagnosis Date   Allergy    Arthritis    osteoarthritis, osteoporosis. Past hx. of skin staph infections-no problems at present   Cancer [facility])    skin-squamous cell right clavicle area- no present problems   H/O oophorectomy    '99   H/O tinnitus    bilateral mild   Hx of seasonal allergies    Past Surgical History:  Procedure Laterality Date   ABDOMINAL HYSTERECTOMY     '01   EYE SURGERY Left 01/2020   Cataract Removal   JOINT REPLACEMENT Right    '09- [facility]   KNEE ARTHROSCOPY Left    '07 meniscus   TOTAL ABDOMINAL HYSTERECTOMY W/ BILATERAL SALPINGOOPHORECTOMY     TOTAL HIP ARTHROPLASTY Left 08/06/2016   Procedure: LEFT TOTAL HIP ARTHROPLASTY ANTERIOR APPROACH;  Surgeon: Gaynelle Arabian, MD;  Location: [facility];  Service: Orthopedics;  Laterality: Left;   TOTAL HIP REVISION Right 07/04/2013   Procedure: RIGHT TOTAL HIP REVISION;  Surgeon: [name], MD;  Location: [facility];  Service: Orthopedics;  Laterality: Right;   Family History  Problem Relation Age of Onset   Arthritis Mother    Arthritis Father    Glaucoma Father    Heart disease Brother    Hypertension Brother    Arthritis Brother    Colon cancer Neg Hx    Esophageal cancer Neg Hx  Stomach cancer Neg Hx    Rectal cancer Neg Hx    Social History   Socioeconomic History   Marital status: Married    Spouse name: [name]   Number of children: 0   Years of education: 0   Highest education level: Bachelor's degree (e.g., BA, AB, BS)  Occupational History   Occupation: Retired  Tobacco Use   Smoking status: Never    Passive exposure: Never   Smokeless tobacco: Never  Vaping Use   Vaping Use: Never used  Substance and Sexual Activity   Alcohol use: Yes    Alcohol/week: 4.0 standard drinks    Types: 4 Glasses of wine per week    Comment: social daily- wine or beer   Drug use: No   Sexual activity: Yes  Other Topics Concern   Not on file   Social History Narrative   Lives with her husband [name] [facility] patient.)    No children.    Enjoys reading, hiking and gardening.    Patient exercises 5-6x per week. Swims, bikes and walks.    High fiber diet.    Social Determinants of Health   Financial Resource Strain: Low Risk    Difficulty of Paying Living Expenses: Not hard at all  Food Insecurity: No Food Insecurity   Worried About Charity fundraiser in the Last Year: Never true   [place] in the Last Year: Never true  Transportation Needs: No Transportation Needs   Lack of Transportation (Medical): No   Lack of Transportation (Non-Medical): No  Physical Activity: Sufficiently Active   Days of Exercise per Week: 6 days   Minutes of Exercise per Session: 30 min  Stress: No Stress Concern Present   Feeling of Stress : Not at all  Social Connections: Moderately Integrated   Frequency of Communication with Friends and Family: More than three times a week   Frequency of Social Gatherings with Friends and Family: More than three times a week   Attends Religious Services: Never   Marine scientist or Organizations: Yes   Attends Archivist Meetings: 1 to 4 times per year   Marital Status: Married   Outpatient Encounter Medications as of 03/12/2021  Medication Sig   ALPRAZolam (XANAX) 0.25 MG tablet Take 1 tablet (0.25 mg total) by mouth daily as needed for anxiety.   baclofen (LIORESAL) 10 MG tablet Take one by mouth at bedtime as needed for back spasm   cholecalciferol (VITAMIN D) 1000 units tablet Take 1,000 Units by mouth daily.   magnesium oxide (MAG-OX) 400 MG tablet Take 400 mg by mouth at bedtime.    Multiple Vitamin (MULTIVITAMIN WITH MINERALS) TABS tablet Take 1 tablet by mouth daily.   Omega-3 Fatty Acids (FISH OIL) 1000 MG CAPS Take by mouth daily.   psyllium (REGULOID) 0.52 G capsule Take 3 capsules by mouth 2 (two) times daily.    traMADol (ULTRAM) 50 MG tablet Take one or two at bedtime prn  for low back pain   tretinoin (RETIN-A) 0.025 % cream Apply 1 application topically at bedtime.   triamcinolone (NASACORT) 55 MCG/ACT AERO nasal inhaler Place 2 sprays into the nose at bedtime.    vitamin E 400 UNIT capsule Take 400 Units by mouth daily.   zolpidem (AMBIEN) 5 MG tablet TAKE ONE TABLET BY MOUTH EVERY NIGHT AT BEDTIME AS NEEDED FOR SLEEP   carbamide peroxide (DEBROX) 6.5 % OTIC solution Place 10 drops into both ears 2 (two) times  daily. (Patient not taking: Reported on 03/12/2021)   montelukast (SINGULAIR) 10 MG tablet Take 10 mg by mouth at bedtime. (Patient not taking: Reported on 03/12/2021)   No facility-administered encounter medications on file as of 03/12/2021.   Activities of Daily Living In your present state of health, do you have any difficulty performing the following activities: 03/12/2021  Hearing? N  Vision? N  Difficulty concentrating or making decisions? N  Walking or climbing stairs? N  Dressing or bathing? N  Doing errands, shopping? N  Preparing Food and eating ? N  Using the Toilet? N  In the past six months, have you accidently leaked urine? N  Do you have problems with loss of bowel control? N  Managing your Medications? N  Managing your Finances? N  Housekeeping or managing your Housekeeping? N  Some recent data might be hidden   Patient Care Team: Dickie La, MD as PCP - General (Family Medicine) Clent Jacks, MD as Referring Physician (Ophthalmology)    Assessment:   This is a routine wellness examination for [place].  Exercise Activities and Dietary recommendations Current Exercise Habits: Home exercise routine, Type of exercise: walking (Bike and Swimming), Time (Minutes): 30, Frequency (Times/Week): 6, Weekly Exercise (Minutes/Week): 180, Exercise limited by: orthopedic condition(s)   Goals      Patient Stated     Maintain current status of physical activity.        Fall Risk Fall Risk  03/12/2021 04/25/2020 05/25/2019 03/07/2019  12/30/2017  Falls in the past year? 1 0 0 0 0  Number falls in past yr: 0 - 0 0 -  Injury with Fall? 0 - - 0 -  Risk for fall due to : No Fall Risks - - - -  Follow up Falls prevention discussed - Falls evaluation completed - -   Patient reports normal gait and balance. Patient does not use assistive devices to ambulate.   Is the patient's home free of loose throw rugs in walkways, pet beds, electrical cords, etc?   yes      Grab bars in the bathroom? yes      Handrails on the stairs?   yes      Adequate lighting?   yes  Patient rating of health (0-10) scale: 8   Depression Screen PHQ 2/9 Scores 03/12/2021 05/25/2019 03/07/2019 12/30/2017  PHQ - 2 Score 0 0 0 0    Cognitive Function 6CIT Screen 03/12/2021  What Year? 0 points  What month? 0 points  What time? 0 points  Count back from 20 0 points  Months in reverse 0 points  Repeat phrase 0 points  Total Score 0   Immunization History  Administered Date(s) Administered   Fluad Quad(high Dose 65+) 10/01/2018   Hepatitis A, Adult 03/06/2021   Hepatitis B, adult 03/06/2021   Influenza-Unspecified 10/13/2020   PFIZER Comirnaty(Gray Top)Covid-19 Tri-Sucrose Vaccine 10/11/2019, 04/11/2020   PFIZER(Purple Top)SARS-COV-2 Vaccination 02/04/2019, 02/24/2019   Pfizer Covid-19 Vaccine Bivalent Booster 8yrs & up 09/24/2020   Pneumococcal Conjugate-13 05/25/2019   Pneumococcal Polysaccharide-23 01/15/2018   Td 05/14/2017   Zoster Recombinat (Shingrix) 10/15/2017, 12/26/2017   Screening Tests Health Maintenance  Topic Date Due   Hepatitis C Screening  Never done   MAMMOGRAM  03/28/2022   COLONOSCOPY (Pts 45-60yrs Insurance coverage will need to be confirmed)  12/07/2023   TETANUS/TDAP  05/15/2027   Pneumonia Vaccine 65+ Years old  Completed   INFLUENZA VACCINE  Completed   DEXA SCAN  Completed  COVID-19 Vaccine  Completed   Zoster Vaccines- Shingrix  Completed   HPV VACCINES  Aged Out   Cancer Screenings: Lung: Low Dose CT  Chest recommended if Age 55-80 years, 20 pack-year currently smoking OR have quit w/in 15years. Patient does not qualify. Breast:  Up to date on Mammogram? Yes   Up to date of Bone Density/Dexa? Yes Colorectal: UTD   Plan:  Annual exam with PCP due in April- will call to schedule.  I have updated your health maintenance to reflect covid, flu and shingles vaccines. Have fun on your trip!  I have personally reviewed and noted the following in the patients chart:   Medical and social history Use of alcohol, tobacco or illicit drugs  Current medications and supplements Functional ability and status Nutritional status Physical activity Advanced directives List of other physicians Hospitalizations, surgeries, and ER visits in previous 12 months Vitals Screenings to include cognitive, depression, and falls Referrals and appointments  In addition, I have reviewed and discussed with patient certain preventive protocols, quality metrics, and best practice recommendations. A written personalized care plan for preventive services as well as general preventive health recommendations were provided to patient.  This visit was conducted virtually in the setting of the [place] pandemic.    Dorna Bloom, [place]  03/19/2021

## 2021-03-19 NOTE — Patient Instructions (Addendum)
You spoke to [name], [place] over the phone for your annual wellness visit.  We discussed goals:   Goals      Patient Stated     Maintain current status of physical activity.        We also discussed recommended health maintenance. Please call our office and schedule a visit. As discussed, you are due for: Health Maintenance  Topic Date Due   Hepatitis C Screening  Never done   MAMMOGRAM  03/28/2022   COLONOSCOPY (Pts 45-84yr Insurance coverage will need to be confirmed)  12/07/2023   TETANUS/TDAP  05/15/2027   Pneumonia Vaccine 71 Years old  Completed   INFLUENZA VACCINE  Completed   DEXA SCAN  Completed   COVID-19 Vaccine  Completed   Zoster Vaccines- Shingrix  Completed   HPV VACCINES  Aged Out   Annual exam with PCP due in April- will call to schedule.  I have updated your health maintenance to reflect covid, flu and shingles vaccines. Have fun on your trip! Preventive Care 626Years and Older, Female Preventive care refers to lifestyle choices and visits with your health care provider that can promote health and wellness. Preventive care visits are also called wellness exams. What can I expect for my preventive care visit? Counseling Your health care provider may ask you questions about your: Medical history, including: Past medical problems. Family medical history. Pregnancy and menstrual history. History of falls. Current health, including: Memory and ability to understand (cognition). Emotional well-being. Home life and relationship well-being. Sexual activity and sexual health. Lifestyle, including: Alcohol, nicotine or tobacco, and drug use. Access to firearms. Diet, exercise, and sleep habits. Work and work [employer] use. Safety issues such as seatbelt and bike helmet use. Physical exam Your health care provider will check your: Height and weight. These may be used to calculate your BMI (body mass index). BMI is a measurement that tells  if you are at a healthy weight. Waist circumference. This measures the distance around your waistline. This measurement also tells if you are at a healthy weight and may help predict your risk of certain diseases, such as type 2 diabetes and high blood pressure. Heart rate and blood pressure. Body temperature. Skin for abnormal spots. What immunizations do I need? Vaccines are usually given at various ages, according to a schedule. Your health care provider will recommend vaccines for you based on your age, medical history, and lifestyle or other factors, such as travel or where you work. What tests do I need? Screening Your health care provider may recommend screening tests for certain conditions. This may include: Lipid and cholesterol levels. Hepatitis C test. Hepatitis B test. HIV (human immunodeficiency virus) test. STI (sexually transmitted infection) testing, if you are at risk. Lung cancer screening. Colorectal cancer screening. Diabetes screening. This is done by checking your blood sugar (glucose) after you have not eaten for a while (fasting). Mammogram. Talk with your health care provider about how often you should have regular mammograms. BRCA-related cancer screening. This may be done if you have a family history of breast, ovarian, tubal, or peritoneal cancers. Bone density scan. This is done to screen for osteoporosis. Talk with your health care provider about your test results, treatment options, and if necessary, the need for more tests. Follow these instructions at home: Eating and drinking  Eat a diet that includes fresh fruits and vegetables, whole grains, lean protein, and low-fat dairy products. Limit your intake of foods with high amounts of  sugar, saturated fats, and salt. Take vitamin and mineral supplements as recommended by your health care provider. Do not drink alcohol if your health care provider tells you not to drink. If you drink alcohol: Limit how much  you have to 0-1 drink a day. Know how much alcohol is in your drink. In the [place] one drink equals one 12 oz bottle of beer (355 mL), one 5 oz glass of wine (148 mL), or one 1 oz glass of hard liquor (44 mL). Lifestyle Brush your teeth every morning and night with fluoride toothpaste. Floss one time each day. Exercise for at least 30 minutes 5 or more days each week. Do not use any products that contain nicotine or tobacco. These products include cigarettes, chewing tobacco, and vaping devices, such as e-cigarettes. If you need help quitting, ask your health care provider. Do not use drugs. If you are sexually active, practice safe sex. Use a condom or other form of protection in order to prevent STIs. Take aspirin only as told by your health care provider. Make sure that you understand how much to take and what form to take. Work with your health care provider to find out whether it is safe and beneficial for you to take aspirin daily. Ask your health care provider if you need to take a cholesterol-lowering medicine (statin). Find healthy ways to manage stress, such as: Meditation, yoga, or listening to music. Journaling. Talking to a trusted person. Spending time with friends and family. Minimize exposure to UV radiation to reduce your risk of skin cancer. Safety Always wear your seat belt while driving or riding in a vehicle. Do not drive: If you have been drinking alcohol. Do not ride with someone who has been drinking. When you are tired or distracted. While texting. If you have been using any mind-altering substances or drugs. Wear a helmet and other protective equipment during sports activities. If you have firearms in your house, make sure you follow all gun safety procedures. What's next? Visit your health care provider once a year for an annual wellness visit. Ask your health care provider how often you should have your eyes and teeth checked. Stay up to date on all  vaccines. This information is not intended to replace advice given to you by your health care provider. Make sure you discuss any questions you have with your health care provider. Document Revised: 06/27/2020 Document Reviewed: 06/27/2020 Elsevier Patient Education  2022 [facility]'s number is [phone]. Please call with questions or concerns about what we discussed today.

## 2021-03-20 NOTE — Progress Notes (Signed)
I have reviewed this visit and agree with the documentation.   

## 2021-04-08 ENCOUNTER — Telehealth: Payer: Self-pay

## 2021-04-08 NOTE — Telephone Encounter (Signed)
Patient calls nurse line reporting concerns from  recent [place] trip. ? ?Patient reports during the trip she believes she was exposed to "bat pee" or some form of "bat secretion." Patient reports her and her husband were sleeping outside and they heard bats over head. Soon after she felt "wetness" in her hair and he felt "wetness" on his face. Patient denies any open wounds and the "wetness" did not get in her/his mouth.  ? ?Patient denies any symptoms of any kind. No SOB, fevers, body aches, diarrhea or nausea/vomiting.  ? ?Patient was told by a friend to notify her PCP.  ? ?Will froward to PCP for advisement.  ? ?Red flags discussed with patient in the meantime for her and her husband.  ?

## 2021-04-09 NOTE — Telephone Encounter (Signed)
Contacted pt and gave her some of the information below and told her that I would send Dr. Kara Mead message through her [name] so she could read it and if she has any questions or concerns please feel free to contact us. [name], CMA ? ?

## 2021-05-01 ENCOUNTER — Encounter: Payer: Self-pay | Admitting: Family Medicine

## 2021-05-03 ENCOUNTER — Encounter: Payer: Self-pay | Admitting: Family Medicine

## 2021-05-08 ENCOUNTER — Ambulatory Visit: Payer: Medicare Other | Admitting: Family Medicine

## 2021-05-08 ENCOUNTER — Encounter: Payer: Self-pay | Admitting: Family Medicine

## 2021-05-08 ENCOUNTER — Ambulatory Visit (INDEPENDENT_AMBULATORY_CARE_PROVIDER_SITE_OTHER): Payer: Medicare Other

## 2021-05-08 VITALS — BP 132/97 | HR 71 | Ht 64.0 in | Wt 134.2 lb

## 2021-05-08 DIAGNOSIS — Z23 Encounter for immunization: Secondary | ICD-10-CM

## 2021-05-08 DIAGNOSIS — Z0000 Encounter for general adult medical examination without abnormal findings: Secondary | ICD-10-CM

## 2021-05-08 DIAGNOSIS — M810 Age-related osteoporosis without current pathological fracture: Secondary | ICD-10-CM

## 2021-05-08 NOTE — Patient Instructions (Signed)
Great to see you!   

## 2021-05-09 NOTE — Assessment & Plan Note (Signed)
Still think she needs a lab work today and we discussed that.  She is up-to-date on all her health maintenance.  She got her fourth COVID shot today which would be her second booster.  Discussed bone density scan I will go ahead and order one for 6 or 7 months.  Follow-up 1 year sooner with problems. ?

## 2021-05-09 NOTE — Progress Notes (Signed)
? ? ?  CHIEF COMPLAINT / HPI: ?Here for well care.  The only concern for her perspective is bone density.  She had some decreased bone density on the last DEXA scan.  She does not really think she wants to take any oral therapy for this but would like to repeat the DEXA scan in about 6 or 7 months.  Otherwise she is doing well.  Recently saw her dermatologist and had completed exam of the skin. ? ? ?PERTINENT  PMH / PSH: I have reviewed the patient?s medications, allergies, past medical and surgical history, smoking status and updated in the EMR as appropriate. ? ? ?OBJECTIVE: ? BP (!) 132/97   Pulse 71   Ht '5\' 4"'$  (1.626 m)   Wt 134 lb 3.2 oz (60.9 kg)   SpO2 100%   BMI 23.04 kg/m?  ?Vital signs reviewed. ?GENERAL: Well-developed, well-nourished, no acute distress. ?CARDIOVASCULAR: Regular rate and rhythm no murmur gallop or rub ?LUNGS: Clear to auscultation bilaterally, no rales or wheeze. ?ABDOMEN: Soft positive bowel sounds ?NEURO: No gross focal neurological deficits. ?MSK: Movement of extremity x 4. ?PSYCH: AxOx4. Good eye contact.. No psychomotor retardation or agitation. Appropriate speech fluency and content. Asks and answers questions appropriately. Mood is congruent. ? ? ?ASSESSMENT / PLAN: ? ? ?Well adult exam ?Still think she needs a lab work today and we discussed that.  She is up-to-date on all her health maintenance.  She got her fourth COVID shot today which would be her second booster.  Discussed bone density scan I will go ahead and order one for 6 or 7 months.  Follow-up 1 year sooner with problems. ?  ?[name] MD ?

## 2021-06-18 ENCOUNTER — Encounter: Payer: Self-pay | Admitting: *Deleted

## 2021-06-28 ENCOUNTER — Encounter: Payer: Self-pay | Admitting: Family Medicine

## 2021-06-28 DIAGNOSIS — G8929 Other chronic pain: Secondary | ICD-10-CM

## 2021-07-03 ENCOUNTER — Other Ambulatory Visit: Payer: Self-pay

## 2021-07-03 ENCOUNTER — Ambulatory Visit: Payer: Medicare Other | Attending: Family Medicine

## 2021-07-03 DIAGNOSIS — G8929 Other chronic pain: Secondary | ICD-10-CM | POA: Insufficient documentation

## 2021-07-03 DIAGNOSIS — M6281 Muscle weakness (generalized): Secondary | ICD-10-CM | POA: Insufficient documentation

## 2021-07-03 DIAGNOSIS — M545 Low back pain, unspecified: Secondary | ICD-10-CM | POA: Insufficient documentation

## 2021-07-03 DIAGNOSIS — M5459 Other low back pain: Secondary | ICD-10-CM | POA: Diagnosis present

## 2021-07-03 NOTE — Therapy (Signed)
OUTPATIENT PHYSICAL THERAPY THORACOLUMBAR EVALUATION   Patient Name: [name] MRN: [id number] DOB:July 18, 1950, 71 y.o., female Today's Date: 07/03/2021   PT End of Session - 07/03/21 1418     Visit Number 1    Number of Visits 17    Date for PT Re-Evaluation 09/04/21    Authorization Type [employer] MCR    Authorization Time Period FOTO v6, v10, kx mod v15    Progress Note Due on Visit 10    PT Start Time 1400    PT Stop Time 1445   3 minutes dry needle insertion   PT Time Calculation (min) 45 min    Activity Tolerance Patient tolerated treatment well    Behavior During Therapy WFL for tasks assessed/performed             Past Medical History:  Diagnosis Date   Allergy    Arthritis    osteoarthritis, osteoporosis. Past hx. of skin staph infections-no problems at present   Cancer [facility])    skin-squamous cell right clavicle area- no present problems   H/O oophorectomy    '99   H/O tinnitus    bilateral mild   Hx of seasonal allergies    Past Surgical History:  Procedure Laterality Date   ABDOMINAL HYSTERECTOMY     '01   EYE SURGERY Left 01/2020   Cataract Removal   JOINT REPLACEMENT Right    '09- [facility]   KNEE ARTHROSCOPY Left    '07 meniscus   TOTAL ABDOMINAL HYSTERECTOMY W/ BILATERAL SALPINGOOPHORECTOMY     TOTAL HIP ARTHROPLASTY Left 08/06/2016   Procedure: LEFT TOTAL HIP ARTHROPLASTY ANTERIOR APPROACH;  Surgeon: Gaynelle Arabian, MD;  Location: [facility];  Service: Orthopedics;  Laterality: Left;   TOTAL HIP REVISION Right 07/04/2013   Procedure: RIGHT TOTAL HIP REVISION;  Surgeon: [name], MD;  Location: [facility];  Service: Orthopedics;  Laterality: Right;   Patient Active Problem List   Diagnosis Date Noted   Osteoporosis without current pathological fracture 04/25/2020   History of hip replacement 04/25/2020   Glaucoma 04/25/2020   Bilateral impacted cerumen 03/07/2019   Postmenopausal 12/05/2016   Allergic rhinitis 12/05/2016   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 She reports doing PT with [facility] in last year for her back pain, to which she responded well, although she had the best results with TPDN. She reports she is interested in having more TPDN performed. Pt denies any N/T or radicular pain related to this problem, although she does report BIL glute pain associated with her back pain. Pt denies any unexplained weight change, nausea/ vomiting, saddle anesthesia, or changes in bowel/ bladder function. Current pain is 5/10. Worst pain is 5/10. Best pain is 2/10. Aggravating factors include bending over, lifting > 10 pounds. Easing factors include ibuprofen, baclofen, tramadol, tennis ball massage, ice, heat.  PERTINENT HISTORY:  Hx of BIL hip replacement with Rt revision, arthritis, report of two Rt hip dislocations in 2019, osteoporosis  PAIN:  Are you having pain? Yes: NPRS scale: 5/10 Pain location: BIL LBP, BIL glutes Pain description: deep ache Aggravating factors: bending over, lifting > 10 pounds Relieving  factors: ibuprofen, baclofen, tramadol, tennis ball massage, ice, heat   PRECAUTIONS: Avoid OMT due to osteoporosis  WEIGHT BEARING RESTRICTIONS No  FALLS:  Has patient fallen in last 6 months? No  LIVING ENVIRONMENT: Lives with: lives with their spouse Lives in: House/apartment Stairs: No Has following equipment at home: Grab bars  OCCUPATION: Retired Industrial/product designer  PLOF: [place] Return to swimming, hiking, gardening   OBJECTIVE:   DIAGNOSTIC FINDINGS:  None related to LBP  PATIENT SURVEYS:  FOTO 50%, predicted 61% in 12 visits  SCREENING FOR RED FLAGS: Bowel or bladder incontinence: No Cauda equina syndrome: No  COGNITION:  Overall cognitive status: Within functional limits for tasks assessed     SENSATION: Not tested  MUSCLE LENGTH: Hamstrings: WNL BIL  Thomas test: Mild BIL hip flexor tightness  POSTURE: decreased lumbar lordosis  PALPATION: TTP to BIL lumbar paraspinals/ QL  PASSIVE ACCESSORIES: Lumbar CPAs hypomobile/ painful L2-L4  LUMBAR ROM:   Active  A/PROM  eval  Flexion WNL, p!  Extension 50%, p!!  Right lateral flexion WNL  Left lateral flexion WNL   Right rotation WNL  Left rotation WNL   (Blank rows = not tested)   LOWER EXTREMITY MMT:    MMT Right eval Left eval  Hip flexion 5/5 5/5  Hip extension 4/5 4/5  Hip abduction 4/5 4/5  Knee flexion 5/5 5/5  Knee extension 5/5 5/5  Ankle dorsiflexion 5/5 5/5  Ankle plantarflexion 5/5 5/5   (Blank rows = not tested)  LUMBAR SPECIAL TESTS:   SLR: (-) BIL PLE: (+) Repeated trunk extension: (-)  FUNCTIONAL TESTS:  5xSTS: 9 seconds Plank: 36 seconds Squat: WNL    TODAY'S TREATMENT   [facility] Adult PT Treatment:                                                DATE: 07/03/2021 Therapeutic Exercise: N/A Manual Therapy: Skilled palpation to identify trigger points prior to TPDN Effleurage to treated muscles following TPDN Neuromuscular  re-ed: N/A Therapeutic Activity: N/A Modalities: N/A Self Care: N/A  Trigger Point Dry-Needling  Treatment instructions: Expect mild to moderate muscle soreness. S/S  of pneumothorax if dry needled over a lung field, and to seek immediate medical attention should they occur. Patient verbalized understanding of these instructions and education.  Patient Consent Given: Yes Education handout provided: No Muscles treated: BIL L2-L3 lumbar multifidi Electrical stimulation performed: No Parameters: N/A Treatment response/outcome: Improved muscle extensibility, reported decrease in pain    PATIENT EDUCATION:  Education details: Pt educated on probable underlying pathophysiology behind her pain presentation, FOTO, POC, prognosis, and HEP Person educated: Patient Education method: Explanation, Demonstration, and Handouts Education comprehension: verbalized understanding and returned demonstration   HOME EXERCISE PROGRAM: Access Code: [email or web address] URL: [email or web address] Date: 07/03/2021 Prepared by: Vanessa Southgate  Exercises - Standard Plank  - 1 x daily - 7 x weekly - 3 sets - 30-60sec hold - Hip Flexor Stretch at Edge of Bed  - 1 x daily - 7 x weekly - 2-min hold - Sidelying Open Book Thoracic Lumbar Rotation and Extension  - 1 x daily - 7 x weekly - 2 sets - 10 reps - Side Plank on Knees  - 1 x daily - 7 x weekly - 3 sets - 30-60 sec hold - Supine Bridge  - 1 x daily - 7 x weekly - 3 sets - 10 reps - 5-sec hold  ASSESSMENT:  CLINICAL IMPRESSION: Patient is a 71 y.o. F who was seen today for physical therapy evaluation and treatment for chronic LBP.  Upon assessment, her primary impairments include painful lumbar flexion and extension AROM, weak BIL hip flexors and extensors, weak functional core strength, hypomobile and painful lumbar passive accessory mobility, and TTP to BIL lumbar paraspinals/ QL. Ruling up mechanical LBP with associated core weakness. The pt  reports a positive response to TPDN and manual techniques today. Pt will benefit from skilled PT to address her primary impairments and return to her prior level of function with less limitation.    OBJECTIVE IMPAIRMENTS decreased mobility, decreased ROM, decreased strength, hypomobility, increased muscle spasms, impaired flexibility, improper body mechanics, postural dysfunction, and pain.   ACTIVITY LIMITATIONS carrying, lifting, bending, and squatting  PARTICIPATION LIMITATIONS: cleaning, laundry, driving, community activity, and yard work  PERSONAL FACTORS Time since onset of injury/illness/exacerbation and 3+ comorbidities: See medical hx  are also affecting patient's functional outcome.   REHAB POTENTIAL: Good  CLINICAL DECISION MAKING: Stable/uncomplicated  EVALUATION COMPLEXITY: Low   GOALS: Goals reviewed with patient? Yes  SHORT TERM GOALS: Target date: 07/31/2021  Pt will report understanding and adherence to initial HEP in order to promote independence in the management of primary impairments. Baseline: HEP provided at eval Goal status: INITIAL   LONG TERM GOALS: Target date: 08/28/2021  Pt will achieve a FOTO score of 61% in order to demonstrate improved functional ability as it relates to her primary impairments. Baseline: 50% Goal status: INITIAL  2.  Pt will achieve a functional lift of 25 pounds from floor to waist level in order to return to gardening with less limitation. Baseline: Pt reports inability to lift >10 pounds due to pain. Goal status: INITIAL  3.  Pt will achieve a plank of 60 seconds or greater in order to demonstrate improved functional core strength in the prophylaxis of future mechanical LBP. Baseline: 36 seconds Goal status: INITIAL  4.  Pt will achieve WNL global lumbar AROM with 0-2/10 pain in order to return to swimming with less limitation. Baseline: See AROM chart, 5/10 pain with affected motions. Goal status: INITIAL    PLAN: PT  FREQUENCY: 2x/week  PT DURATION: 8  weeks  PLANNED INTERVENTIONS: Therapeutic exercises, Therapeutic activity, Neuromuscular re-education, Balance training, Gait training, Patient/Family education, Joint mobilization, Stair training, DME instructions, Aquatic Therapy, Dry Needling, Electrical stimulation, Spinal mobilization, Cryotherapy, Moist heat, Taping, Traction, Biofeedback, Ionotophoresis '4mg'$ /ml Dexamethasone, Manual therapy, and Re-evaluation.  PLAN FOR NEXT SESSION: Progress core/ hip strengthening and trunk mobility   Vanessa Carlton, PT, DPT 07/03/21 2:55 PM

## 2021-07-12 ENCOUNTER — Ambulatory Visit: Payer: Medicare Other

## 2021-07-12 DIAGNOSIS — M6281 Muscle weakness (generalized): Secondary | ICD-10-CM

## 2021-07-12 DIAGNOSIS — M5459 Other low back pain: Secondary | ICD-10-CM | POA: Diagnosis not present

## 2021-07-12 NOTE — Therapy (Signed)
OUTPATIENT PHYSICAL THERAPY TREATMENT NOTE   Patient Name: [name] MRN: [id number] DOB:[birth year], 71 y.o., female Today's Date: 07/12/2021  PCP: Dickie La, MD REFERRING PROVIDER: Dickie La, MD  END OF SESSION:   PT End of Session - 07/12/21 1131     Visit Number 2    Number of Visits 17    Date for PT Re-Evaluation 09/04/21    Authorization Type [employer] MCR    Authorization Time Period FOTO v6, v10, kx mod v15    Progress Note Due on Visit 10    PT Start Time 1132    PT Stop Time 1214   4 minutes TPDN prior to attended E-stim   PT Time Calculation (min) 42 min    Activity Tolerance Patient tolerated treatment well    Behavior During Therapy WFL for tasks assessed/performed             Past Medical History:  Diagnosis Date   Allergy    Arthritis    osteoarthritis, osteoporosis. Past hx. of skin staph infections-no problems at present   Cancer [facility])    skin-squamous cell right clavicle area- no present problems   H/O oophorectomy    '99   H/O tinnitus    bilateral mild   Hx of seasonal allergies    Past Surgical History:  Procedure Laterality Date   ABDOMINAL HYSTERECTOMY     '01   EYE SURGERY Left 01/2020   Cataract Removal   JOINT REPLACEMENT Right    '09- [facility]   KNEE ARTHROSCOPY Left    '07 meniscus   TOTAL ABDOMINAL HYSTERECTOMY W/ BILATERAL SALPINGOOPHORECTOMY     TOTAL HIP ARTHROPLASTY Left 08/06/2016   Procedure: LEFT TOTAL HIP ARTHROPLASTY ANTERIOR APPROACH;  Surgeon: Gaynelle Arabian, MD;  Location: [facility];  Service: Orthopedics;  Laterality: Left;   TOTAL HIP REVISION Right 07/04/2013   Procedure: RIGHT TOTAL HIP REVISION;  Surgeon: [name], MD;  Location: [facility];  Service: Orthopedics;  Laterality: Right;   Patient Active Problem List   Diagnosis Date Noted   Osteoporosis without current pathological fracture 04/25/2020   History of hip replacement 04/25/2020   Glaucoma 04/25/2020   Bilateral impacted cerumen 03/07/2019    Postmenopausal 12/05/2016   Allergic rhinitis 12/05/2016   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 12/05/2016   Well adult exam 12/05/2016   OA (osteoarthritis) of hip 08/06/2016    REFERRING DIAG: M54.50,G89.29 (ICD-10-CM) - Chronic low back pain, unspecified back pain laterality, unspecified whether sciatica present  THERAPY DIAG:  Other low back pain  Muscle weakness (generalized)  Rationale for Evaluation and Treatment Rehabilitation  PERTINENT HISTORY: Hx of BIL hip replacement with Rt revision, arthritis, report of two Rt hip dislocations in 2019, osteoporosis  PRECAUTIONS: Avoid OMT due to osteoporosis  SUBJECTIVE: Pt reports having a hard week due to attending her brother's memorial service, in addition to her LBP. She reports that her low back felt great the day after eval from the TPDN, although she reports increased pain the next few days. She reports non-adherence to her HEP due to these factors.  PAIN:  Are you having pain? Yes: NPRS scale: 6-7/10 Pain location: BIL LBP, BIL glutes Pain description: deep ache Aggravating factors: bending over, lifting > 10 pounds Relieving factors: ibuprofen, baclofen, tramadol, tennis ball massage, ice, heat   OBJECTIVE: (objective measures completed at initial evaluation unless otherwise dated)   DIAGNOSTIC FINDINGS:  None related to LBP   PATIENT  SURVEYS:  FOTO 50%, predicted 61% in 12 visits   SCREENING FOR RED FLAGS: Bowel or bladder incontinence: No Cauda equina syndrome: No   COGNITION:           Overall cognitive status: Within functional limits for tasks assessed                          SENSATION: Not tested   MUSCLE LENGTH: Hamstrings: WNL BIL  Thomas test: Mild BIL hip flexor tightness   POSTURE: decreased lumbar lordosis   PALPATION: TTP to BIL lumbar paraspinals/ QL   PASSIVE ACCESSORIES: Lumbar CPAs hypomobile/ painful L2-L4   LUMBAR ROM:    Active  A/PROM  eval  Flexion WNL,  p!  Extension 50%, p!!  Right lateral flexion WNL  Left lateral flexion WNL   Right rotation WNL  Left rotation WNL   (Blank rows = not tested)     LOWER EXTREMITY MMT:     MMT Right eval Left eval  Hip flexion 5/5 5/5  Hip extension 4/5 4/5  Hip abduction 4/5 4/5  Knee flexion 5/5 5/5  Knee extension 5/5 5/5  Ankle dorsiflexion 5/5 5/5  Ankle plantarflexion 5/5 5/5   (Blank rows = not tested)   LUMBAR SPECIAL TESTS:    SLR: (-) BIL PLE: (+) Repeated trunk extension: (-)   FUNCTIONAL TESTS:  5xSTS: 9 seconds Plank: 36 seconds Squat: WNL       TODAY'S TREATMENT   [facility] Adult PT Treatment:                                                DATE: 07/12/2021 Therapeutic Exercise: Seated trunk rotation 2x10 BIL Hooklying TrA activation with press-down to thighs and diaphragmatic breathing 4x30sec Prone swimmers 2x20 Squat to table 2x8   Manual Therapy: Skilled palpation to identify trigger points prior to TPDN Effleurage to treated muscles following TPDN Neuromuscular re-ed: Attended E-stim TPDN to L1-L2 lumbar multifidi, parameters specified below x8 minutes Therapeutic Activity: N/A Modalities: N/A Self Care: N/A  Trigger Point Dry-Needling  Treatment instructions: Expect mild to moderate muscle soreness. S/S of pneumothorax if dry needled over a lung field, and to seek immediate medical attention should they occur. Patient verbalized understanding of these instructions and education.   Patient Consent Given: Yes Education handout provided: No Muscles treated: BIL L1-L2 lumbar multifidi Electrical stimulation performed: Yes Parameters: mA, 2 frequency, level 2 intensity Treatment response/outcome: Improved muscle extensibility, reported decrease in pain   [facility] Adult PT Treatment:                                                DATE: 07/03/2021 Therapeutic Exercise: N/A Manual Therapy: Skilled palpation to identify trigger points prior to TPDN Effleurage to  treated muscles following TPDN Neuromuscular re-ed: N/A Therapeutic Activity: N/A Modalities: N/A Self Care: N/A   Trigger Point Dry-Needling  Treatment instructions: Expect mild to moderate muscle soreness. S/S of pneumothorax if dry needled over a lung field, and to seek immediate medical attention should they occur. Patient verbalized understanding of these instructions and education.   Patient Consent Given: Yes Education handout provided: No Muscles treated: BIL L2-L3 lumbar multifidi Electrical stimulation performed:  No Parameters: N/A Treatment response/outcome: Improved muscle extensibility, reported decrease in pain       PATIENT EDUCATION:  Education details: Pt educated on probable underlying pathophysiology behind her pain presentation, FOTO, POC, prognosis, and HEP Person educated: Patient Education method: Explanation, Demonstration, and Handouts Education comprehension: verbalized understanding and returned demonstration     HOME EXERCISE PROGRAM: Access Code: [email or web address] URL: [email or web address] Date: 07/03/2021 Prepared by: Vanessa Noonday   Exercises - Standard Plank  - 1 x daily - 7 x weekly - 3 sets - 30-60sec hold - Hip Flexor Stretch at Edge of Bed  - 1 x daily - 7 x weekly - 2-min hold - Sidelying Open Book Thoracic Lumbar Rotation and Extension  - 1 x daily - 7 x weekly - 2 sets - 10 reps - Side Plank on Knees  - 1 x daily - 7 x weekly - 3 sets - 30-60 sec hold - Supine Bridge  - 1 x daily - 7 x weekly - 3 sets - 10 reps - 5-sec hold   ASSESSMENT:   CLINICAL IMPRESSION: Pt responded well to all interventions today, demonstrating good form and no pain with early core strengthening exercises. Additionally, she responded very well to E-stim TPDN, reporting a decrease in pain from 7/10 to 4/10 and demonstrating improved muscle extensibility. She will continue to benefit from skilled PT to address her primary impairments and return to her  prior level of function with less limitation.     OBJECTIVE IMPAIRMENTS decreased mobility, decreased ROM, decreased strength, hypomobility, increased muscle spasms, impaired flexibility, improper body mechanics, postural dysfunction, and pain.    ACTIVITY LIMITATIONS carrying, lifting, bending, and squatting   PARTICIPATION LIMITATIONS: cleaning, laundry, driving, community activity, and yard work   PERSONAL FACTORS Time since onset of injury/illness/exacerbation and 3+ comorbidities: See medical hx  are also affecting patient's functional outcome.       GOALS: Goals reviewed with patient? Yes   SHORT TERM GOALS: Target date: 07/31/2021   Pt will report understanding and adherence to initial HEP in order to promote independence in the management of primary impairments. Baseline: HEP provided at eval Goal status: INITIAL     LONG TERM GOALS: Target date: 08/28/2021   Pt will achieve a FOTO score of 61% in order to demonstrate improved functional ability as it relates to her primary impairments. Baseline: 50% Goal status: INITIAL   2.  Pt will achieve a functional lift of 25 pounds from floor to waist level in order to return to gardening with less limitation. Baseline: Pt reports inability to lift >10 pounds due to pain. Goal status: INITIAL   3.  Pt will achieve a plank of 60 seconds or greater in order to demonstrate improved functional core strength in the prophylaxis of future mechanical LBP. Baseline: 36 seconds Goal status: INITIAL   4.  Pt will achieve WNL global lumbar AROM with 0-2/10 pain in order to return to swimming with less limitation. Baseline: See AROM chart, 5/10 pain with affected motions. Goal status: INITIAL       PLAN: PT FREQUENCY: 2x/week   PT DURATION: 8 weeks   PLANNED INTERVENTIONS: Therapeutic exercises, Therapeutic activity, Neuromuscular re-education, Balance training, Gait training, Patient/Family education, Joint mobilization, Stair  training, DME instructions, Aquatic Therapy, Dry Needling, Electrical stimulation, Spinal mobilization, Cryotherapy, Moist heat, Taping, Traction, Biofeedback, Ionotophoresis '4mg'$ /ml Dexamethasone, Manual therapy, and Re-evaluation.   PLAN FOR NEXT SESSION: Progress core/ hip strengthening and trunk mobility    Vanessa [place], PT, DPT  07/12/21 12:15 PM

## 2021-07-18 ENCOUNTER — Ambulatory Visit: Payer: Medicare Other | Attending: Family Medicine

## 2021-07-18 DIAGNOSIS — M5459 Other low back pain: Secondary | ICD-10-CM | POA: Diagnosis not present

## 2021-07-18 DIAGNOSIS — M6281 Muscle weakness (generalized): Secondary | ICD-10-CM | POA: Insufficient documentation

## 2021-07-18 NOTE — Therapy (Signed)
OUTPATIENT PHYSICAL THERAPY TREATMENT NOTE   Patient Name: [name] MRN: [id number] DOB:October 15, 1950, 71 y.o., female Today's Date: 07/18/2021  PCP: Dickie La, MD REFERRING PROVIDER: Dickie La, MD  END OF SESSION:   PT End of Session - 07/18/21 1259     Visit Number 3    Number of Visits 17    Date for PT Re-Evaluation 09/04/21    Authorization Type UHC MCR    Authorization Time Period FOTO v6, v10, kx mod v15    Progress Note Due on Visit 10    PT Start Time 1300    PT Stop Time 1340    PT Time Calculation (min) 40 min    Activity Tolerance Patient tolerated treatment well    Behavior During Therapy WFL for tasks assessed/performed              Past Medical History:  Diagnosis Date   Allergy    Arthritis    osteoarthritis, osteoporosis. Past hx. 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   H/O oophorectomy    '99   H/O tinnitus    bilateral mild   Hx of seasonal allergies    Past Surgical History:  Procedure Laterality Date   ABDOMINAL HYSTERECTOMY     '01   EYE SURGERY Left 01/2020   Cataract Removal   JOINT REPLACEMENT Right    '09- R[facility]A   KNEE ARTHROSCOPY Left    '07 meniscus   TOTAL ABDOMINAL HYSTERECTOMY W/ BILATERAL SALPINGOOPHORECTOMY     TOTAL HIP ARTHROPLASTY Left 08/06/2016   Procedure: LEFT TOTAL HIP ARTHROPLASTY ANTERIOR APPROACH;  Surgeon: Gaynelle Arabian, MD;  Location: [facility];  Service: Orthopedics;  Laterality: Left;   TOTAL HIP REVISION Right 07/04/2013   Procedure: RIGHT TOTAL HIP REVISION;  Surgeon: [name], MD;  Location: [facility];  Service: Orthopedics;  Laterality: Right;   Patient Active Problem List   Diagnosis Date Noted   Osteoporosis without current pathological fracture 04/25/2020   History of hip replacement 04/25/2020   Glaucoma 04/25/2020   Bilateral impacted cerumen 03/07/2019   Postmenopausal 12/05/2016   Allergic  rhinitis 12/05/2016   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 12/05/2016   Well adult exam 12/05/2016   OA (osteoarthritis) of hip 08/06/2016    REFERRING DIAG: M54.50,G89.29 (ICD-10-CM) - Chronic low back pain, unspecified back pain laterality, unspecified whether sciatica present  THERAPY DIAG:  Other low back pain  Muscle weakness (generalized)  Rationale for Evaluation and Treatment Rehabilitation  PERTINENT HISTORY: Hx of BIL hip replacement with Rt revision, arthritis, report of two Rt hip dislocations in 2019, osteoporosis  PRECAUTIONS: Avoid OMT due to osteoporosis  SUBJECTIVE: Patient reports to PT with continued back pain, though lessened this session.   PAIN:  Are you having pain? Yes: NPRS scale: 3/10 Pain location: BIL LBP, BIL glutes Pain description: deep ache Aggravating factors: bending over, lifting > 10 pounds Relieving factors: ibuprofen, baclofen, tramadol, tennis ball massage, ice, heat   OBJECTIVE: (objective measures completed at initial evaluation unless otherwise dated)   DIAGNOSTIC FINDINGS:  None related to LBP   PATIENT SURVEYS:  FOTO 50%, predicted 61% in 12 visits   SCREENING FOR RED FLAGS: Bowel or bladder incontinence: No Cauda equina syndrome: No   COGNITION:           Overall cognitive status: Within functional limits for tasks assessed  SENSATION: Not tested   MUSCLE LENGTH: Hamstrings: WNL BIL  Thomas test: Mild BIL hip flexor tightness   POSTURE: decreased lumbar lordosis   PALPATION: TTP to BIL lumbar paraspinals/ QL   PASSIVE ACCESSORIES: Lumbar CPAs hypomobile/ painful L2-L4   LUMBAR ROM:    Active  A/PROM  eval  Flexion WNL, p!  Extension 50%, p!!  Right lateral flexion WNL  Left lateral flexion WNL   Right rotation WNL  Left rotation WNL   (Blank rows = not tested)     LOWER EXTREMITY MMT:     MMT Right eval Left eval  Hip flexion 5/5 5/5  Hip extension 4/5  4/5  Hip abduction 4/5 4/5  Knee flexion 5/5 5/5  Knee extension 5/5 5/5  Ankle dorsiflexion 5/5 5/5  Ankle plantarflexion 5/5 5/5   (Blank rows = not tested)   LUMBAR SPECIAL TESTS:    SLR: (-) BIL PLE: (+) Repeated trunk extension: (-)   FUNCTIONAL TESTS:  5xSTS: 9 seconds Plank: 36 seconds Squat: WNL       TODAY'S TREATMENT  [facility] Adult PT Treatment:                                                DATE: 07/18/2021 Therapeutic Exercise: Nustep level 3 x 5 mins Seated trunk rotation x10 BIL Hooklying TrA activation with press-down to thighs and diaphragmatic breathing 4x30sec Squat to table 2x8 Bridges x5  Open books x10 BIL Modified thomas stretch EOM x1' BIL Modalities: Estim IFC current 15 intensity, x10 mins post session with patient positioned in prone with pillow under abdomen   [facility] Adult PT Treatment:                                                DATE: 07/12/2021 Therapeutic Exercise: Seated trunk rotation 2x10 BIL Hooklying TrA activation with press-down to thighs and diaphragmatic breathing 4x30sec Prone swimmers 2x20 Squat to table 2x8   Manual Therapy: Skilled palpation to identify trigger points prior to TPDN Effleurage to treated muscles following TPDN Neuromuscular re-ed: Attended E-stim TPDN to L1-L2 lumbar multifidi, parameters specified below x8 minutes Therapeutic Activity: N/A Modalities: N/A Self Care: N/A  Trigger Point Dry-Needling  Treatment instructions: Expect mild to moderate muscle soreness. S/S of pneumothorax if dry needled over a lung field, and to seek immediate medical attention should they occur. Patient verbalized understanding of these instructions and education.   Patient Consent Given: Yes Education handout provided: No Muscles treated: BIL L1-L2 lumbar multifidi Electrical stimulation performed: Yes Parameters: mA, 2 frequency, level 2 intensity Treatment response/outcome: Improved muscle extensibility, reported decrease  in pain   [facility] Adult PT Treatment:                                                DATE: 07/03/2021 Therapeutic Exercise: N/A Manual Therapy: Skilled palpation to identify trigger points prior to TPDN Effleurage to treated muscles following TPDN Neuromuscular re-ed: N/A Therapeutic Activity: N/A Modalities: N/A Self Care: N/A   Trigger Point Dry-Needling  Treatment instructions: Expect mild to moderate muscle soreness. S/S of pneumothorax  if dry needled over a lung field, and to seek immediate medical attention should they occur. Patient verbalized understanding of these instructions and education.   Patient Consent Given: Yes Education handout provided: No Muscles treated: BIL L2-L3 lumbar multifidi Electrical stimulation performed: No Parameters: N/A Treatment response/outcome: Improved muscle extensibility, reported decrease in pain       PATIENT EDUCATION:  Education details: Pt educated on probable underlying pathophysiology behind her pain presentation, FOTO, POC, prognosis, and HEP Person educated: Patient Education method: Explanation, Demonstration, and Handouts Education comprehension: verbalized understanding and returned demonstration     HOME EXERCISE PROGRAM: Access Code: [email or web address] URL: [email or web address] Date: 07/03/2021 Prepared by: Vanessa Cedar   Exercises - Standard Plank  - 1 x daily - 7 x weekly - 3 sets - 30-60sec hold - Hip Flexor Stretch at Edge of Bed  - 1 x daily - 7 x weekly - 2-min hold - Sidelying Open Book Thoracic Lumbar Rotation and Extension  - 1 x daily - 7 x weekly - 2 sets - 10 reps - Side Plank on Knees  - 1 x daily - 7 x weekly - 3 sets - 30-60 sec hold - Supine Bridge  - 1 x daily - 7 x weekly - 3 sets - 10 reps - 5-sec hold   ASSESSMENT:   CLINICAL IMPRESSION: Patient presents to PT with lessened overall pain in her lower back and reports HEP compliance. Session today focused on continued core strengthening  and e-stim with IFC current for pain modulation at the end of the session. Patient is interested in continuing TPDN with e-stim going forward as she had good reduction in pain from this. Patient continues to benefit from skilled PT services and should be progressed as able to improve functional independence.      OBJECTIVE IMPAIRMENTS decreased mobility, decreased ROM, decreased strength, hypomobility, increased muscle spasms, impaired flexibility, improper body mechanics, postural dysfunction, and pain.    ACTIVITY LIMITATIONS carrying, lifting, bending, and squatting   PARTICIPATION LIMITATIONS: cleaning, laundry, driving, community activity, and yard work   PERSONAL FACTORS Time since onset of injury/illness/exacerbation and 3+ comorbidities: See medical hx  are also affecting patient's functional outcome.       GOALS: Goals reviewed with patient? Yes   SHORT TERM GOALS: Target date: 07/31/2021   Pt will report understanding and adherence to initial HEP in order to promote independence in the management of primary impairments. Baseline: HEP provided at eval Goal status: INITIAL     LONG TERM GOALS: Target date: 08/28/2021   Pt will achieve a FOTO score of 61% in order to demonstrate improved functional ability as it relates to her primary impairments. Baseline: 50% Goal status: INITIAL   2.  Pt will achieve a functional lift of 25 pounds from floor to waist level in order to return to gardening with less limitation. Baseline: Pt reports inability to lift >10 pounds due to pain. Goal status: INITIAL   3.  Pt will achieve a plank of 60 seconds or greater in order to demonstrate improved functional core strength in the prophylaxis of future mechanical LBP. Baseline: 36 seconds Goal status: INITIAL   4.  Pt will achieve WNL global lumbar AROM with 0-2/10 pain in order to return to swimming with less limitation. Baseline: See AROM chart, 5/10 pain with affected motions. Goal  status: INITIAL       PLAN: PT FREQUENCY: 2x/week   PT DURATION: 8 weeks   PLANNED INTERVENTIONS: Therapeutic exercises, Therapeutic activity, Neuromuscular  re-education, Balance training, Gait training, Patient/Family education, Joint mobilization, Stair training, DME instructions, Aquatic Therapy, Dry Needling, Electrical stimulation, Spinal mobilization, Cryotherapy, Moist heat, Taping, Traction, Biofeedback, Ionotophoresis '4mg'$ /ml Dexamethasone, Manual therapy, and Re-evaluation.   PLAN FOR NEXT SESSION: Progress core/ hip strengthening and trunk mobility    Evelene Croon, PTA 07/18/21 12:59 PM

## 2021-07-23 ENCOUNTER — Ambulatory Visit: Payer: Medicare Other

## 2021-07-23 DIAGNOSIS — M6281 Muscle weakness (generalized): Secondary | ICD-10-CM

## 2021-07-23 DIAGNOSIS — M5459 Other low back pain: Secondary | ICD-10-CM | POA: Diagnosis not present

## 2021-07-23 NOTE — Therapy (Signed)
OUTPATIENT PHYSICAL THERAPY TREATMENT NOTE   Patient Name: [name] MRN: [id number] DOB:[birth year], 71 y.o., female Today's Date: 07/23/2021  PCP: Dickie La, MD REFERRING PROVIDER: Dickie La, MD  END OF SESSION:   PT End of Session - 07/23/21 1450     Visit Number 4    Number of Visits 17    Date for PT Re-Evaluation 09/04/21    Authorization Type UHC MCR    Authorization Time Period FOTO v6, v10, kx mod v15    Progress Note Due on Visit 10    PT Start Time 1450    PT Stop Time 1530    PT Time Calculation (min) 40 min    Activity Tolerance Patient tolerated treatment well;Patient limited by pain    Behavior During Therapy WFL for tasks assessed/performed               Past Medical History:  Diagnosis Date   Allergy    Arthritis    osteoarthritis, osteoporosis. Past hx. 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   H/O oophorectomy    '99   H/O tinnitus    bilateral mild   Hx of seasonal allergies    Past Surgical History:  Procedure Laterality Date   ABDOMINAL HYSTERECTOMY     '01   EYE SURGERY Left 01/2020   Cataract Removal   JOINT REPLACEMENT Right    '09- RTHA   KNEE ARTHROSCOPY Left    '07 meniscus   TOTAL ABDOMINAL HYSTERECTOMY W/ BILATERAL SALPINGOOPHORECTOMY     TOTAL HIP ARTHROPLASTY Left 08/06/2016   Procedure: LEFT TOTAL HIP ARTHROPLASTY ANTERIOR APPROACH;  Surgeon: Gaynelle Arabian, MD;  Location: [facility];  Service: Orthopedics;  Laterality: Left;   TOTAL HIP REVISION Right 07/04/2013   Procedure: RIGHT TOTAL HIP REVISION;  Surgeon: [name], MD;  Location: [facility];  Service: Orthopedics;  Laterality: Right;   Patient Active Problem List   Diagnosis Date Noted   Osteoporosis without current pathological fracture 04/25/2020   History of hip replacement 04/25/2020   Glaucoma 04/25/2020   Bilateral impacted cerumen 03/07/2019   Postmenopausal  12/05/2016   Allergic rhinitis 12/05/2016   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   PAIN:  Are you having pain? Yes: NPRS scale: 3/10 Pain location: BIL LBP, BIL glutes Pain description: deep ache Aggravating factors: bending over, lifting > 10 pounds Relieving factors: ibuprofen, baclofen, tramadol, tennis ball massage, ice, heat   OBJECTIVE: (objective measures completed at initial evaluation unless otherwise dated)   DIAGNOSTIC FINDINGS:  None related to LBP   PATIENT SURVEYS:  FOTO 50%, predicted 61% in 12 visits   SCREENING FOR RED FLAGS: Bowel or bladder incontinence: No Cauda equina syndrome: No   COGNITION:           Overall cognitive status: Within functional limits for tasks  assessed                          SENSATION: Not tested   MUSCLE LENGTH: Hamstrings: WNL BIL  Thomas test: Mild BIL hip flexor tightness   POSTURE: decreased lumbar lordosis   PALPATION: TTP to BIL lumbar paraspinals/ QL   PASSIVE ACCESSORIES: Lumbar CPAs hypomobile/ painful L2-L4   LUMBAR ROM:    Active  A/PROM  eval  Flexion WNL, p!  Extension 50%, p!!  Right lateral flexion WNL  Left lateral flexion WNL   Right rotation WNL  Left rotation WNL   (Blank rows = not tested)     LOWER EXTREMITY MMT:     MMT Right eval Left eval  Hip flexion 5/5 5/5  Hip extension 4/5  4/5  Hip abduction 4/5 4/5  Knee flexion 5/5 5/5  Knee extension 5/5 5/5  Ankle dorsiflexion 5/5 5/5  Ankle plantarflexion 5/5 5/5   (Blank rows = not tested)   LUMBAR SPECIAL TESTS:    SLR: (-) BIL PLE: (+) Repeated trunk extension: (-)   FUNCTIONAL TESTS:  5xSTS: 9 seconds Plank: 36 seconds Squat: WNL       TODAY'S TREATMENT  [facility] Adult PT Treatment:                                                DATE: 07/23/2021 Therapeutic Exercise: Nustep level 3 x 5 mins Seated trunk rotation x10 BIL Hooklying TrA activation with press-down to thighs and diaphragmatic breathing 4x30sec Bridges x10 Open books x10 BIL LTR x10 BIL SKTC x1' BIL Modified thomas stretch EOM x1' BIL Supine figure 4 piriformis stretch 2x30" BIL with legs pulled in  Modalities: Estim IFC current 18 intensity, x10 mins with patient positioned in prone with pillow under abdomen   [facility] Adult PT Treatment:                                                DATE: 07/18/2021 Therapeutic Exercise: Nustep level 3 x 5 mins Seated trunk rotation x10 BIL Hooklying TrA activation with press-down to thighs and diaphragmatic breathing 4x30sec Squat to table 2x8 Bridges x5  Open books x10 BIL Modified thomas stretch EOM x1' BIL Modalities: Estim IFC current 15 intensity, x10 mins post session with patient positioned in prone with pillow under abdomen   [facility] Adult PT Treatment:                                                DATE: 07/12/2021 Therapeutic Exercise: Seated trunk rotation 2x10 BIL Hooklying TrA activation with press-down to thighs and diaphragmatic breathing 4x30sec Prone swimmers 2x20 Squat to table 2x8   Manual Therapy: Skilled palpation to identify trigger points prior to TPDN Effleurage to treated muscles following TPDN Neuromuscular re-ed: Attended E-stim TPDN to L1-L2 lumbar multifidi, parameters specified below x8 minutes Therapeutic Activity: N/A Modalities: N/A Self Care: N/A  Trigger  Point Dry-Needling  Treatment instructions: Expect mild to moderate muscle soreness. S/S of pneumothorax if dry needled over a lung field, and to seek immediate  medical attention should they occur. Patient verbalized understanding of these instructions and education.   Patient Consent Given: Yes Education handout provided: No Muscles treated: BIL L1-L2 lumbar multifidi Electrical stimulation performed: Yes Parameters: mA, 2 frequency, level 2 intensity Treatment response/outcome: Improved muscle extensibility, reported decrease in pain       PATIENT EDUCATION:  Education details: Pt educated on probable underlying pathophysiology behind her pain presentation, FOTO, POC, prognosis, and HEP Person educated: Patient Education method: Explanation, Demonstration, and Handouts Education comprehension: verbalized understanding and returned demonstration     HOME EXERCISE PROGRAM: Access Code: [email or web address] URL: [email or web address] Date: 07/03/2021 Prepared by: Vanessa Glorieta   Exercises - Standard Plank  - 1 x daily - 7 x weekly - 3 sets - 30-60sec hold - Hip Flexor Stretch at Edge of Bed  - 1 x daily - 7 x weekly - 2-min hold - Sidelying Open Book Thoracic Lumbar Rotation and Extension  - 1 x daily - 7 x weekly - 2 sets - 10 reps - Side Plank on Knees  - 1 x daily - 7 x weekly - 3 sets - 30-60 sec hold - Supine Bridge  - 1 x daily - 7 x weekly - 3 sets - 10 reps - 5-sec hold   ASSESSMENT:   CLINICAL IMPRESSION: Patient presents to PT with decreased overall pain in her lower back this session and reports HEP compliance. Session today focused on core strengthening and pain modulation with IFC e-stim at end of session. She remains limited by pain throughout session and performs exercises slowly and methodically, though we were able to complete more exercises and repetitions this session. Patient continues to benefit from skilled PT services and should be progressed as able to  improve functional independence.    OBJECTIVE IMPAIRMENTS decreased mobility, decreased ROM, decreased strength, hypomobility, increased muscle spasms, impaired flexibility, improper body mechanics, postural dysfunction, and pain.    ACTIVITY LIMITATIONS carrying, lifting, bending, and squatting   PARTICIPATION LIMITATIONS: cleaning, laundry, driving, community activity, and yard work   PERSONAL FACTORS Time since onset of injury/illness/exacerbation and 3+ comorbidities: See medical hx  are also affecting patient's functional outcome.       GOALS: Goals reviewed with patient? Yes   SHORT TERM GOALS: Target date: 07/31/2021   Pt will report understanding and adherence to initial HEP in order to promote independence in the management of primary impairments. Baseline: HEP provided at eval Goal status: INITIAL     LONG TERM GOALS: Target date: 08/28/2021   Pt will achieve a FOTO score of 61% in order to demonstrate improved functional ability as it relates to her primary impairments. Baseline: 50% Goal status: INITIAL   2.  Pt will achieve a functional lift of 25 pounds from floor to waist level in order to return to gardening with less limitation. Baseline: Pt reports inability to lift >10 pounds due to pain. Goal status: INITIAL   3.  Pt will achieve a plank of 60 seconds or greater in order to demonstrate improved functional core strength in the prophylaxis of future mechanical LBP. Baseline: 36 seconds Goal status: INITIAL   4.  Pt will achieve WNL global lumbar AROM with 0-2/10 pain in order to return to swimming with less limitation. Baseline: See AROM chart, 5/10 pain with affected motions. Goal status: INITIAL       PLAN: PT FREQUENCY: 2x/week   PT DURATION: 8 weeks   PLANNED INTERVENTIONS: Therapeutic exercises, Therapeutic activity, Neuromuscular re-education, Balance training, Gait training, Patient/Family  education, Joint mobilization, Stair training, DME  instructions, Aquatic Therapy, Dry Needling, Electrical stimulation, Spinal mobilization, Cryotherapy, Moist heat, Taping, Traction, Biofeedback, Ionotophoresis '4mg'$ /ml Dexamethasone, Manual therapy, and Re-evaluation.   PLAN FOR NEXT SESSION: Progress core/ hip strengthening and trunk mobility    Evelene Croon, PTA 07/23/21 2:51 PM

## 2021-07-25 ENCOUNTER — Ambulatory Visit: Payer: Medicare Other

## 2021-07-25 DIAGNOSIS — M5459 Other low back pain: Secondary | ICD-10-CM | POA: Diagnosis not present

## 2021-07-25 DIAGNOSIS — M6281 Muscle weakness (generalized): Secondary | ICD-10-CM

## 2021-07-25 NOTE — Therapy (Addendum)
OUTPATIENT PHYSICAL THERAPY TREATMENT NOTE/ DISCHARGE SUMMARY   Patient Name: [name] MRN: [id number] DOB:Apr 12, 1950, 71 y.o., female Today's Date: 07/25/2021  PCP: Dickie La, MD REFERRING PROVIDER: Dickie La, MD  END OF SESSION:   PT End of Session - 07/25/21 1544     Visit Number 5    Number of Visits 17    Date for PT Re-Evaluation 09/04/21    Authorization Type UHC MCR    Authorization Time Period FOTO v6, v10, kx mod v15    Progress Note Due on Visit 10    PT Start Time 1532    PT Stop Time 1612    PT Time Calculation (min) 40 min    Activity Tolerance Patient tolerated treatment well    Behavior During Therapy WFL for tasks assessed/performed                Past Medical History:  Diagnosis Date   Allergy    Arthritis    osteoarthritis, osteoporosis. Past hx. 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   H/O oophorectomy    '99   H/O tinnitus    bilateral mild   Hx of seasonal allergies    Past Surgical History:  Procedure Laterality Date   ABDOMINAL HYSTERECTOMY     '01   EYE SURGERY Left 01/2020   Cataract Removal   JOINT REPLACEMENT Right    '09- RTHA   KNEE ARTHROSCOPY Left    '07 meniscus   TOTAL ABDOMINAL HYSTERECTOMY W/ BILATERAL SALPINGOOPHORECTOMY     TOTAL HIP ARTHROPLASTY Left 08/06/2016   Procedure: LEFT TOTAL HIP ARTHROPLASTY ANTERIOR APPROACH;  Surgeon: Gaynelle Arabian, MD;  Location: [facility];  Service: Orthopedics;  Laterality: Left;   TOTAL HIP REVISION Right 07/04/2013   Procedure: RIGHT TOTAL HIP REVISION;  Surgeon: [name], MD;  Location: [facility];  Service: Orthopedics;  Laterality: Right;   Patient Active Problem List   Diagnosis Date Noted   Osteoporosis without current pathological fracture 04/25/2020   History of hip replacement 04/25/2020   Glaucoma 04/25/2020   Bilateral impacted cerumen 03/07/2019   Postmenopausal  12/05/2016   Allergic rhinitis 12/05/2016   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 12/05/2016   Well adult exam 12/05/2016   OA (osteoarthritis) of hip 08/06/2016    REFERRING DIAG: M54.50,G89.29 (ICD-10-CM) - Chronic low back pain, unspecified back pain laterality, unspecified whether sciatica present  THERAPY DIAG:  Other low back pain  Muscle weakness (generalized)  Rationale for Evaluation and Treatment Rehabilitation  PERTINENT HISTORY: Hx of BIL hip replacement with Rt revision, arthritis, report of two Rt hip dislocations in 2019, osteoporosis  PRECAUTIONS: Avoid OMT due to osteoporosis  SUBJECTIVE: Pt reports 2-3/10 pain today, adding that she has noticed a pattern of improvement since starting PT. She reports adherence to her HEP.  PAIN:  Are you having pain? Yes: NPRS scale: 2-3/10 Pain location: BIL LBP, BIL glutes Pain description: deep ache Aggravating factors: bending over, lifting > 10 pounds Relieving factors: ibuprofen, baclofen, tramadol, tennis ball massage, ice, heat   OBJECTIVE: (objective measures completed at initial evaluation unless otherwise dated)   DIAGNOSTIC FINDINGS:  None related to LBP   PATIENT SURVEYS:  FOTO 50%, predicted 61% in 12 visits   SCREENING FOR RED FLAGS: Bowel or bladder incontinence: No Cauda equina syndrome: No   COGNITION:  Overall cognitive status: Within functional limits for tasks assessed                          SENSATION: Not tested   MUSCLE LENGTH: Hamstrings: WNL BIL  Thomas test: Mild BIL hip flexor tightness   POSTURE: decreased lumbar lordosis   PALPATION: TTP to BIL lumbar paraspinals/ QL   PASSIVE ACCESSORIES: Lumbar CPAs hypomobile/ painful L2-L4   LUMBAR ROM:    Active  A/PROM  eval  Flexion WNL, p!  Extension 50%, p!!  Right lateral flexion WNL  Left lateral flexion WNL   Right rotation WNL  Left rotation WNL   (Blank rows = not tested)     LOWER  EXTREMITY MMT:     MMT Right eval Left eval  Hip flexion 5/5 5/5  Hip extension 4/5 4/5  Hip abduction 4/5 4/5  Knee flexion 5/5 5/5  Knee extension 5/5 5/5  Ankle dorsiflexion 5/5 5/5  Ankle plantarflexion 5/5 5/5   (Blank rows = not tested)   LUMBAR SPECIAL TESTS:    SLR: (-) BIL PLE: (+) Repeated trunk extension: (-)   FUNCTIONAL TESTS:  5xSTS: 9 seconds Plank: 36 seconds Squat: WNL       TODAY'S TREATMENT   [facility] Adult PT Treatment:                                                DATE: 07/25/2021 Therapeutic Exercise: Prone BIL hip extension and abduction with YTB around ankles 2x10 Side knee planks x15 BIL Supine 90/90 abdominal isometric with handhold resistance 2x30sec Supine single leg marching with hip flexion handhold resistance x10 BIL Supine LTR x10 BIL Supine DKTC stretch x95mn Manual Therapy: Skilled palpation to identify lumbar trigger points prior to TPDN Effleurage/ STM to lumbar paraspinals following TPDN x6 minutes Neuromuscular re-ed: Attended E-stim TPDN to L1-L2 lumbar multifidi, parameters specified below x8 minutes Therapeutic Activity: N/A Modalities: N/A Self Care: N/A  Trigger Point Dry-Needling  Treatment instructions: Expect mild to moderate muscle soreness. S/S of pneumothorax if dry needled over a lung field, and to seek immediate medical attention should they occur. Patient verbalized understanding of these instructions and education.   Patient Consent Given: Yes Education handout provided: No Muscles treated: BIL L1-L2 lumbar multifidi Electrical stimulation performed: Yes Parameters: mA, 2 frequency, level 2 intensity Treatment response/outcome: Improved muscle extensibility, reported decrease in pain   [facility] Adult PT Treatment:                                                DATE: 07/23/2021 Therapeutic Exercise: Nustep level 3 x 5 mins Seated trunk rotation x10 BIL Hooklying TrA activation with press-down to thighs and  diaphragmatic breathing 4x30sec Bridges x10 Open books x10 BIL LTR x10 BIL SKTC x1' BIL Modified thomas stretch EOM x1' BIL Supine figure 4 piriformis stretch 2x30" BIL with legs pulled in  Modalities: Estim IFC current 18 intensity, x10 mins with patient positioned in prone with pillow under abdomen   [facility] Adult PT Treatment:  DATE: 07/18/2021 Therapeutic Exercise: Nustep level 3 x 5 mins Seated trunk rotation x10 BIL Hooklying TrA activation with press-down to thighs and diaphragmatic breathing 4x30sec Squat to table 2x8 Bridges x5  Open books x10 BIL Modified thomas stretch EOM x1' BIL Modalities: Estim IFC current 15 intensity, x10 mins post session with patient positioned in prone with pillow under abdomen        PATIENT EDUCATION:  Education details: Pt educated on probable underlying pathophysiology behind her pain presentation, FOTO, POC, prognosis, and HEP Person educated: Patient Education method: Explanation, Demonstration, and Handouts Education comprehension: verbalized understanding and returned demonstration     HOME EXERCISE PROGRAM: Access Code: [email or web address] URL: [email or web address] Date: 07/03/2021 Prepared by: Vanessa [place]   Exercises - Standard Plank  - 1 x daily - 7 x weekly - 3 sets - 30-60sec hold - Hip Flexor Stretch at Edge of Bed  - 1 x daily - 7 x weekly - 2-min hold - Sidelying Open Book Thoracic Lumbar Rotation and Extension  - 1 x daily - 7 x weekly - 2 sets - 10 reps - Side Plank on Knees  - 1 x daily - 7 x weekly - 3 sets - 30-60 sec hold - Supine Bridge  - 1 x daily - 7 x weekly - 3 sets - 10 reps - 5-sec hold   ASSESSMENT:   CLINICAL IMPRESSION: Pt continues to report therapeutic response to E-stim TPDN and manual techniques and was able to progress strengthening exercises following these techniques. She will continue to benefit from skilled PT to address her primary  impairments and return to her prior level of function with less limitation.   OBJECTIVE IMPAIRMENTS decreased mobility, decreased ROM, decreased strength, hypomobility, increased muscle spasms, impaired flexibility, improper body mechanics, postural dysfunction, and pain.    ACTIVITY LIMITATIONS carrying, lifting, bending, and squatting   PARTICIPATION LIMITATIONS: cleaning, laundry, driving, community activity, and yard work   PERSONAL FACTORS Time since onset of injury/illness/exacerbation and 3+ comorbidities: See medical hx  are also affecting patient's functional outcome.       GOALS: Goals reviewed with patient? Yes   SHORT TERM GOALS: Target date: 07/31/2021   Pt will report understanding and adherence to initial HEP in order to promote independence in the management of primary impairments. Baseline: HEP provided at eval 07/25/2021: Pt reports adherence to her HEP Goal status: ACHIEVED     LONG TERM GOALS: Target date: 08/28/2021   Pt will achieve a FOTO score of 61% in order to demonstrate improved functional ability as it relates to her primary impairments. Baseline: 50% Goal status: INITIAL   2.  Pt will achieve a functional lift of 25 pounds from floor to waist level in order to return to gardening with less limitation. Baseline: Pt reports inability to lift >10 pounds due to pain. Goal status: INITIAL   3.  Pt will achieve a plank of 60 seconds or greater in order to demonstrate improved functional core strength in the prophylaxis of future mechanical LBP. Baseline: 36 seconds Goal status: INITIAL   4.  Pt will achieve WNL global lumbar AROM with 0-2/10 pain in order to return to swimming with less limitation. Baseline: See AROM chart, 5/10 pain with affected motions. Goal status: INITIAL       PLAN: PT FREQUENCY: 2x/week   PT DURATION: 8 weeks   PLANNED INTERVENTIONS: Therapeutic exercises, Therapeutic activity, Neuromuscular re-education, Balance training,  Gait training, Patient/Family education, Joint mobilization, Stair training, DME instructions, Aquatic Therapy, Dry  Needling, Electrical stimulation, Spinal mobilization, Cryotherapy, Moist heat, Taping, Traction, Biofeedback, Ionotophoresis 51m/ml Dexamethasone, Manual therapy, and Re-evaluation.   PLAN FOR NEXT SESSION: Progress core/ hip strengthening and trunk mobility    YVanessa [place] PT, DPT 07/25/21 4:12 PM   PHYSICAL THERAPY DISCHARGE SUMMARY  Visits from Start of Care: 5  Current functional level related to goals / functional outcomes: Unable to assess   Remaining deficits: Unable to assess   Education / Equipment: HEP   Patient agrees to discharge. Patient goals were not met. Patient is being discharged due to not returning since the last visit.  YVanessa [place] PT, DPT 09/05/21 9:50 AM

## 2021-08-03 ENCOUNTER — Encounter: Payer: Medicare Other | Admitting: Physical Therapy

## 2021-12-17 ENCOUNTER — Other Ambulatory Visit: Payer: Medicare Other

## 2021-12-17 ENCOUNTER — Ambulatory Visit
Admission: RE | Admit: 2021-12-17 | Discharge: 2021-12-17 | Disposition: A | Discharge status: Home / Self Care | Payer: Medicare Other | Source: Ambulatory Visit | Attending: Family Medicine | Admitting: Family Medicine

## 2021-12-17 DIAGNOSIS — M810 Age-related osteoporosis without current pathological fracture: Secondary | ICD-10-CM

## 2021-12-20 ENCOUNTER — Encounter: Payer: Self-pay | Admitting: Family Medicine

## 2022-01-22 ENCOUNTER — Encounter: Payer: Self-pay | Admitting: Family Medicine

## 2022-02-12 ENCOUNTER — Other Ambulatory Visit: Payer: Self-pay | Admitting: Family Medicine

## 2022-03-05 ENCOUNTER — Ambulatory Visit: Payer: Medicare Other | Admitting: Family Medicine

## 2022-03-05 ENCOUNTER — Encounter: Payer: Self-pay | Admitting: Family Medicine

## 2022-03-05 ENCOUNTER — Encounter: Payer: Self-pay | Admitting: Internal Medicine

## 2022-03-05 VITALS — BP 112/62 | HR 80 | Ht 64.0 in | Wt 134.8 lb

## 2022-03-05 DIAGNOSIS — M810 Age-related osteoporosis without current pathological fracture: Secondary | ICD-10-CM | POA: Diagnosis not present

## 2022-03-05 DIAGNOSIS — F418 Other specified anxiety disorders: Secondary | ICD-10-CM | POA: Diagnosis not present

## 2022-03-05 DIAGNOSIS — R1310 Dysphagia, unspecified: Secondary | ICD-10-CM | POA: Diagnosis not present

## 2022-03-05 NOTE — Progress Notes (Signed)
    CHIEF COMPLAINT / HPI: Difficulty swallowing: Several months ago she swallowed a pill and it seemed to get stuck.  That bothered her for a day or so and then resolved.  A few weeks after that she had similar sensation.  Since then she has had increasing problems swallowing some things.  Feels like it goes down but slowly.  Never had any reflux symptoms.  No nausea.  No weight loss.  Otherwise feels fine. 2.  Wanted to also consider treatment for osteoporosis.  We had briefly talked about this via text.  Her husband is on Prolia.  States she saw her GYN in follow-up in that physician told her that even though she was in the osteoporotic range that her FRAX score was less than 20% so the gynecologist recommended maintaining her exercise level, she checked her vitamin D which was evidently normal.   PERTINENT  PMH / PSH: I have reviewed the patient's medications, allergies, past medical and surgical history, smoking status and updated in the EMR as appropriate. DEXXA 12/17/2021 ASSESSMENT: The BMD measured at Forearm Radius 33% is 0.613 g/cm2 with a T-score of -3.0. This patient is considered osteoporotic according to [facility]) criteria.   The quality of the exam is good. Right hip and Left hip were excluded due to surgical hardware.   Site Region Measured Date Measured Age [age]A BMD Significant CHANGE T-score Left Forearm Radius 33% 12/17/2023 68.9 -[age] 0.613 g/cm2 Left Forearm Radius 33% 11/04/2019 66.8 -3.2 0.600 g/cm2   AP Spine L1-L4 12/17/2021 68.9 -1.5 1.025 g/cm2 AP Spine L1-L4 11/04/2019 66.8 -1.6 1.008 g/cm2  OBJECTIVE:  BP 112/62   Pulse 80   Ht 5' 4"$  (1.626 m)   Wt 134 lb 12.8 oz (61.1 kg)   SpO2 98%   BMI 23.14 kg/m  GENERAL: Well-developed female no acute distress NECK: Supple.  No mass.  ASSESSMENT / PLAN:   Osteoporosis without current pathological fracture Discussed DEXA scan and possible treatments.  There are some newer treatments.  Her  husband is on Prolia.  She might be willing to consider.  Her GYN doctor told her that her recommendation was not to start therapy unless her FRAX score was less than 20.  I gave her some information and handout form and she will let me know if she wants to further discuss.  Dysphagia Problem swallowing pills and now some foods.  Has been occurring for about the last 3 to 4 months.  And concern for esophageal stricture.  Will send her back to her GI provider, Dr. Hilarie Fredrickson, for further eval and management.   [name] MD

## 2022-03-05 NOTE — Patient Instructions (Signed)
Great to see you!   

## 2022-03-05 NOTE — Assessment & Plan Note (Signed)
Discussed DEXA scan and possible treatments.  There are some newer treatments.  Her husband is on Prolia.  She might be willing to consider.  Her GYN doctor told her that her recommendation was not to start therapy unless her FRAX score was less than 20.  I gave her some information and handout form and she will let me know if she wants to further discuss.

## 2022-03-06 ENCOUNTER — Encounter: Payer: Self-pay | Admitting: Family Medicine

## 2022-03-06 LAB — LIPID PANEL
Chol/HDL Ratio: 2.8 ratio (ref 0.0–4.4)
Cholesterol, Total: 256 mg/dL — ABNORMAL HIGH (ref 100–199)
HDL: 92 mg/dL (ref >39)
LDL Chol Calc (NIH): 148 mg/dL — ABNORMAL HIGH (ref 0–99)
Triglycerides: 96 mg/dL (ref 0–149)
VLDL Cholesterol Cal: 16 mg/dL (ref 5–40)

## 2022-03-06 LAB — COMPREHENSIVE METABOLIC PANEL
ALT: 12 IU/L (ref 0–32)
AST: 19 IU/L (ref 0–40)
Albumin/Globulin Ratio: 1.8 (ref 1.2–2.2)
Albumin: 4.4 g/dL (ref 3.9–4.9)
Alkaline Phosphatase: 82 IU/L (ref 44–121)
BUN/Creatinine Ratio: 16 (ref 12–28)
BUN: 12 mg/dL (ref 8–27)
Bilirubin Total: 0.4 mg/dL (ref 0.0–1.2)
CO2: 27 mmol/L (ref 20–29)
Calcium: 10.3 mg/dL (ref 8.7–10.3)
Chloride: 102 mmol/L (ref 96–106)
Creatinine, Ser: 0.75 mg/dL (ref 0.57–1.00)
Globulin, Total: 2.4 g/dL (ref 1.5–4.5)
Glucose: 77 mg/dL (ref 70–99)
Potassium: 4.6 mmol/L (ref 3.5–5.2)
Sodium: 142 mmol/L (ref 134–144)
Total Protein: 6.8 g/dL (ref 6.0–8.5)
eGFR: 86 mL/min/{1.73_m2} (ref >59)

## 2022-03-07 ENCOUNTER — Encounter: Payer: Self-pay | Admitting: Family Medicine

## 2022-03-07 DIAGNOSIS — R1310 Dysphagia, unspecified: Secondary | ICD-10-CM | POA: Insufficient documentation

## 2022-03-07 NOTE — Assessment & Plan Note (Signed)
Problem swallowing pills and now some foods.  Has been occurring for about the last 3 to 4 months.  And concern for esophageal stricture.  Will send her back to her GI provider, Dr. Hilarie Fredrickson, for further eval and management.

## 2022-03-12 ENCOUNTER — Encounter: Payer: Self-pay | Admitting: Family Medicine

## 2022-05-09 ENCOUNTER — Encounter: Payer: Self-pay | Admitting: *Deleted

## 2022-05-20 ENCOUNTER — Encounter: Payer: Self-pay | Admitting: Family Medicine

## 2022-06-08 ENCOUNTER — Encounter: Payer: Self-pay | Admitting: Physician Assistant

## 2022-06-08 ENCOUNTER — Telehealth: Payer: Medicare Other | Admitting: Physician Assistant

## 2022-06-08 DIAGNOSIS — U071 COVID-19: Secondary | ICD-10-CM | POA: Diagnosis not present

## 2022-06-08 DIAGNOSIS — R051 Acute cough: Secondary | ICD-10-CM | POA: Diagnosis not present

## 2022-06-08 MED ORDER — BENZONATATE 100 MG PO CAPS: 100.0000 mg | ORAL_CAPSULE | Freq: Three times a day (TID) | ORAL | 0 refills | Status: DC | PRN

## 2022-06-08 MED ORDER — MOLNUPIRAVIR EUA 200MG CAPSULE: 4.0000 | ORAL_CAPSULE | Freq: Two times a day (BID) | ORAL | 0 refills | Status: DC

## 2022-06-08 NOTE — Patient Instructions (Signed)
Milon Score, thank you for joining Tylene Fantasia Ward, PA-C for today's virtual visit.  While this provider is not your primary care provider (PCP), if your PCP is located in our provider database this encounter information will be shared with them immediately following your visit.   A [facility] MyChart account gives you access to today's visit and all your visits, tests, and labs performed at [facility] " click here if you don't have a [facility] MyChart account or go to [email or web address]  Consent: (Patient) [name] provided verbal consent for this virtual visit at the beginning of the encounter.  Current Medications:  Current Outpatient Medications:    benzonatate (TESSALON) 100 MG capsule, Take 1 capsule (100 mg total) by mouth 3 (three) times daily as needed., Disp: 20 capsule, Rfl: 0   molnupiravir EUA (LAGEVRIO) 200 mg CAPS capsule, Take 4 capsules (800 mg total) by mouth 2 (two) times daily for 5 days., Disp: 40 capsule, Rfl: 0   ALPRAZolam (XANAX) 0.25 MG tablet, Take 1 tablet (0.25 mg total) by mouth daily as needed for anxiety., Disp: 30 tablet, Rfl: 3   azelastine (ASTELIN) 0.1 % nasal spray, Place 1 spray into both nostrils daily. Use in each nostril as directed, Disp: , Rfl:    baclofen (LIORESAL) 10 MG tablet, Take one by mouth at bedtime as needed for back spasm, Disp: 30 each, Rfl: 3   calcium citrate (CALCITRATE - DOSED IN MG ELEMENTAL CALCIUM) 950 (200 Ca) MG tablet, Take 200 mg of elemental calcium by mouth daily., Disp: , Rfl:    cholecalciferol (VITAMIN D) 1000 units tablet, Take 1,000 Units by mouth daily. (Patient not taking: Reported on 07/03/2021), Disp: , Rfl:    magnesium oxide (MAG-OX) 400 MG tablet, Take 400 mg by mouth at bedtime. , Disp: , Rfl:    Multiple Vitamin (MULTIVITAMIN WITH MINERALS) TABS tablet, Take 1 tablet by mouth daily., Disp: , Rfl:    Omega-3 Fatty Acids (FISH OIL) 1000 MG CAPS, Take by mouth daily., Disp: , Rfl:     psyllium (REGULOID) 0.52 G capsule, Take 3 capsules by mouth 2 (two) times daily. , Disp: , Rfl:    traMADol (ULTRAM) 50 MG tablet, Take one or two at bedtime prn for low back pain, Disp: 30 tablet, Rfl: 2   tretinoin (RETIN-A) 0.025 % cream, Apply 1 application topically at bedtime., Disp: , Rfl:    triamcinolone (NASACORT) 55 MCG/ACT AERO nasal inhaler, Place 2 sprays into the nose at bedtime.  (Patient not taking: Reported on 07/03/2021), Disp: , Rfl:    vitamin E 400 UNIT capsule, Take 400 Units by mouth daily., Disp: , Rfl:    zolpidem (AMBIEN) 5 MG tablet, TAKE ONE TABLET BY MOUTH EVERY NIGHT AT BEDTIME AS NEEDED FOR SLEEP, Disp: 30 tablet, Rfl: 3   Medications ordered in this encounter:  Meds ordered this encounter  Medications   benzonatate (TESSALON) 100 MG capsule    Sig: Take 1 capsule (100 mg total) by mouth 3 (three) times daily as needed.    Dispense:  20 capsule    Refill:  0    Order Specific Question:   Supervising Provider    Answer:   Merrilee Jansky [[id number]]   molnupiravir EUA (LAGEVRIO) 200 mg CAPS capsule    Sig: Take 4 capsules (800 mg total) by mouth 2 (two) times daily for 5 days.    Dispense:  40 capsule    Refill:  0  Order Specific Question:   Supervising Provider    Answer:   Merrilee Jansky [[id number]]     *If you need refills on other medications prior to your next appointment, please contact your pharmacy*  Follow-Up: Call back or seek an in-person evaluation if the symptoms worsen or if the condition fails to improve as anticipated.  [facility] [phone]  Other Instructions Continue with nasal spray.  Recommend Mucinex.  Can take Tessalon as needed for cough.  Drink plenty of fluids, rest.  If symptoms become worse please go for in person evaluation with your PCP or Urgent Care.    If you have been instructed to have an in-person evaluation today at a local Urgent Care facility, please use the link below. It will take you to a  list of all of our available [facility], including address, phone number and hours of operation. Please do not delay care.  [facility]  If you or a family member do not have a primary care provider, use the link below to schedule a visit and establish care. When you choose a [facility] primary care physician or advanced practice provider, you gain a long-term partner in health. Find a Primary Care Provider  Learn more about [facility]'s in-office and virtual care options: [facility] - Get Care Now

## 2022-06-08 NOTE — Progress Notes (Signed)
Virtual Visit Consent   [name], you are scheduled for a virtual visit with a [facility] provider today. Just as with appointments in the office, your consent must be obtained to participate. Your consent will be active for this visit and any virtual visit you may have with one of our providers in the next 365 days. If you have a MyChart account, a copy of this consent can be sent to you electronically.  As this is a virtual visit, video technology does not allow for your provider to perform a traditional examination. This may limit your provider's ability to fully assess your condition. If your provider identifies any concerns that need to be evaluated in person or the need to arrange testing (such as labs, EKG, etc.), we will make arrangements to do so. Although advances in technology are sophisticated, we cannot ensure that it will always work on either your end or our end. If the connection with a video visit is poor, the visit may have to be switched to a telephone visit. With either a video or telephone visit, we are not always able to ensure that we have a secure connection.  By engaging in this virtual visit, you consent to the provision of healthcare and authorize for your insurance to be billed (if applicable) for the services provided during this visit. Depending on your insurance coverage, you may receive a charge related to this service.  I need to obtain your verbal consent now. Are you willing to proceed with your visit today? [name] has provided verbal consent on 06/08/2022 for a virtual visit (video or telephone). Martha Fantasia Ward, PA-C  Date: 06/08/2022 12:16 PM  Virtual Visit via Video Note   I, [name], connected with  [name]  ([id number], 09/19/1950) on 06/08/22 at 12:00 PM EDT by a video-enabled telemedicine application and verified that I am speaking with the correct person using two identifiers.  Location: Patient: Virtual Visit Location Patient:  Home Provider: Virtual Visit Location Provider: Home   I discussed the limitations of evaluation and management by telemedicine and the availability of in person appointments. The patient expressed understanding and agreed to proceed.    History of Present Illness: [name] is a 72 y.o. who identifies as a female who was assigned female at birth, and is being seen today for congestion, headache, low grade fever, fatigue that started yesterday.  She just returned from [place] yesterday. Tested positive for COVID on home test. Has had COVID vaccine.  No lung disease or asthma.  At this time she is taking affrin, zinc lozenges.  She denies trouble breathing, wheezing, or shortness of breath. Husband sick with similar sx, also tested positive for COVID on home test.   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   Oxycodone Nausea And Vomiting   Medications:  Current Outpatient Medications:    benzonatate (TESSALON) 100 MG capsule, Take 1 capsule (100 mg total) by mouth 3 (three) times daily as needed., Disp: 20 capsule, Rfl: 0   molnupiravir EUA (LAGEVRIO) 200 mg CAPS capsule, Take 4 capsules (800 mg total) by mouth 2 (two) times daily for 5 days., Disp: 40 capsule, Rfl: 0   ALPRAZolam (XANAX) 0.25 MG tablet, Take 1 tablet (0.25 mg total) by mouth daily as needed for  anxiety., Disp: 30 tablet, Rfl: 3   azelastine (ASTELIN) 0.1 % nasal spray, Place 1 spray into both nostrils daily. Use in each nostril as directed, Disp: , Rfl:    baclofen (LIORESAL) 10 MG tablet, Take one by mouth at bedtime as needed for back spasm, Disp: 30 each, Rfl: 3   calcium citrate (CALCITRATE - DOSED IN MG ELEMENTAL CALCIUM) 950 (200 Ca) MG tablet, Take 200 mg of elemental calcium by mouth daily., Disp: , Rfl:    cholecalciferol (VITAMIN D) 1000 units tablet, Take 1,000 Units by mouth daily. (Patient not taking: Reported on 07/03/2021), Disp: , Rfl:    magnesium oxide (MAG-OX) 400 MG tablet, Take 400 mg by mouth at bedtime. , Disp: , Rfl:    Multiple Vitamin (MULTIVITAMIN WITH MINERALS) TABS tablet, Take 1 tablet by mouth daily., Disp: , Rfl:    Omega-3 Fatty Acids (FISH OIL) 1000 MG CAPS, Take by mouth daily., Disp: , Rfl:    psyllium (REGULOID) 0.52 G capsule, Take 3 capsules by mouth 2 (two) times daily. , Disp: , Rfl:    traMADol (ULTRAM) 50 MG tablet, Take one or two at bedtime prn for low back pain, Disp: 30 tablet, Rfl: 2   tretinoin (RETIN-A) 0.025 % cream, Apply 1 application topically at bedtime., Disp: , Rfl:    triamcinolone (NASACORT) 55 MCG/ACT AERO nasal inhaler, Place 2 sprays into the nose at bedtime.  (Patient not taking: Reported on 07/03/2021), Disp: , Rfl:    vitamin E 400 UNIT capsule, Take 400 Units by mouth daily., Disp: , Rfl:    zolpidem (AMBIEN) 5 MG tablet, TAKE ONE TABLET BY MOUTH EVERY NIGHT AT BEDTIME AS NEEDED FOR SLEEP, Disp: 30 tablet, Rfl: 3  Observations/Objective: Patient is well-developed, well-nourished in no acute distress.  Resting comfortably at home.  Head is normocephalic, atraumatic.  No labored breathing.  Speech is clear and coherent with logical content.  Patient is alert and oriented at baseline.    Assessment and Plan: 1. COVID - molnupiravir EUA (LAGEVRIO) 200 mg CAPS capsule; Take 4 capsules (800 mg total) by mouth 2 (two) times  daily for 5 days.  Dispense: 40 capsule; Refill: 0  2. Acute cough - benzonatate (TESSALON) 100 MG capsule; Take 1 capsule (100 mg total) by mouth 3 (three) times daily as needed.  Dispense: 20 capsule; Refill: 0  No recent GFR, discussed decreased efficacy of molnupiravir.  Discussed supportive care alone given mild sx.  Pt will start molnupiravir and continue with supportive care.  In person evaluation precautions discussed.    Follow Up Instructions: I discussed the assessment and treatment plan with the patient. The patient was provided an opportunity to ask questions and all were answered. The patient agreed with the plan and demonstrated an understanding of the instructions.  A copy of instructions were sent to the patient via MyChart unless otherwise noted below.     The patient was advised to call back or seek an in-person evaluation if the symptoms worsen or if the condition fails to  improve as anticipated.  Time:  I spent 20 minutes with the patient via telehealth technology discussing the above problems/concerns.    Martha Fantasia Ward, PA-C

## 2022-06-13 ENCOUNTER — Encounter: Payer: Self-pay | Admitting: Internal Medicine

## 2022-06-13 ENCOUNTER — Ambulatory Visit: Payer: Medicare Other | Admitting: Internal Medicine

## 2022-06-13 VITALS — BP 122/68 | HR 70 | Ht 64.0 in | Wt 133.2 lb

## 2022-06-13 DIAGNOSIS — R1319 Other dysphagia: Secondary | ICD-10-CM | POA: Diagnosis not present

## 2022-06-13 DIAGNOSIS — K208 Other esophagitis without bleeding: Secondary | ICD-10-CM | POA: Diagnosis not present

## 2022-06-13 DIAGNOSIS — T50905A Adverse effect of unspecified drugs, medicaments and biological substances, initial encounter: Secondary | ICD-10-CM

## 2022-06-13 DIAGNOSIS — Z1211 Encounter for screening for malignant neoplasm of colon: Secondary | ICD-10-CM

## 2022-06-13 DIAGNOSIS — R1310 Dysphagia, unspecified: Secondary | ICD-10-CM

## 2022-06-13 NOTE — Progress Notes (Signed)
Patient ID: [name], female   DOB: 03-09-1950, 72 y.o.   MRN: [id number] HPI: [name] is a 72 year old female with a history of colonic diverticulosis, osteoarthritis who is seen in consult at the request of Dr. Jennette Kettle to evaluate esophageal pain and mild dysphagia.  She is here alone today.  I see her husband [name].  She previously saw Dr. Dickie Garcia.  She had a negative colonoscopy with the exception of mild sigmoid diverticulosis in November 2015.  She reports on or about 13 January 2022 she noticed a pill that got stuck after she took this late at night without a full glass of water.  She woke up with a discomfort in her mid chest.  This was a burning type sensation.  Occurred again about a week later.  Since this time she has noticed some intermittent slow transit of solid food but no further odynophagia.  No nausea or vomiting.  No heartburn.  This sensation in her mid chest has improved over the last month but not completely resolved.  She has altered her diet a little bit including cutting up her pills for administration.  She has been very aware to drink plenty of fluid after medications particularly at night.  She has not had any nausea or vomiting.  No weight loss.  No abdominal pain.  No change in bowel habit.  No blood in stool.  Past Medical History:  Diagnosis Date   Allergy    Arthritis    osteoarthritis, osteoporosis. Past hx. 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   Diverticulosis    H/O oophorectomy    '99   H/O tinnitus    bilateral mild   Hx of seasonal allergies     Past Surgical History:  Procedure Laterality Date   ABDOMINAL HYSTERECTOMY     '01   EYE SURGERY Left 01/2020   Cataract Removal   JOINT REPLACEMENT Right    '09- [facility]   KNEE ARTHROSCOPY Left    '07 meniscus   TOTAL ABDOMINAL HYSTERECTOMY W/ BILATERAL SALPINGOOPHORECTOMY     TOTAL HIP ARTHROPLASTY  Left 08/06/2016   Procedure: LEFT TOTAL HIP ARTHROPLASTY ANTERIOR APPROACH;  Surgeon: Ollen Gross, MD;  Location: [facility];  Service: Orthopedics;  Laterality: Left;   TOTAL HIP REVISION Right 07/04/2013   Procedure: RIGHT TOTAL HIP REVISION;  Surgeon: Loanne Drilling, MD;  Location: [facility];  Service: Orthopedics;  Laterality: Right;    Outpatient Medications Prior to Visit  Medication Sig Dispense Refill   ALPRAZolam (XANAX) 0.25 MG tablet Take 1 tablet (0.25 mg total) by mouth daily as needed for anxiety. 30 tablet 3   azelastine (ASTELIN) 0.1 % nasal spray Place 1 spray into both nostrils daily. Use in each nostril as directed     baclofen (LIORESAL) 10 MG tablet Take one by mouth at bedtime as needed for back spasm 30 each 3   calcium citrate (CALCITRATE - DOSED IN MG ELEMENTAL CALCIUM) 950 (200 Ca) MG tablet Take 200 mg of elemental calcium by mouth daily.     cholecalciferol (VITAMIN D) 1000 units tablet Take 1,000 Units by mouth daily.     magnesium oxide (MAG-OX) 400 MG tablet Take 400 mg by mouth at bedtime.      Multiple Vitamin (MULTIVITAMIN WITH MINERALS) TABS tablet Take 1 tablet by mouth daily.     Omega-3 Fatty Acids (FISH OIL) 1000 MG CAPS  Take by mouth daily.     psyllium (REGULOID) 0.52 G capsule Take 3 capsules by mouth 2 (two) times daily.      traMADol (ULTRAM) 50 MG tablet Take one or two at bedtime prn for low back pain 30 tablet 2   tretinoin (RETIN-A) 0.025 % cream Apply 1 application topically at bedtime.     vitamin E 400 UNIT capsule Take 400 Units by mouth daily.     zolpidem (AMBIEN) 5 MG tablet TAKE ONE TABLET BY MOUTH EVERY NIGHT AT BEDTIME AS NEEDED FOR SLEEP 30 tablet 3   benzonatate (TESSALON) 100 MG capsule Take 1 capsule (100 mg total) by mouth 3 (three) times daily as needed. (Patient not taking: Reported on 06/13/2022) 20 capsule 0   molnupiravir EUA (LAGEVRIO) 200 mg CAPS capsule Take 4 capsules (800 mg total) by mouth 2 (two) times daily for 5 days.  (Patient not taking: Reported on 06/13/2022) 40 capsule 0   triamcinolone (NASACORT) 55 MCG/ACT AERO nasal inhaler Place 2 sprays into the nose at bedtime.  (Patient not taking: Reported on 07/03/2021)     No facility-administered medications prior to visit.    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   Oxycodone Nausea And Vomiting    Family History  Problem Relation Age of Onset   Arthritis Mother    Arthritis Father    Glaucoma Father    Heart disease Brother    Hypertension Brother    Arthritis Brother    Colon cancer Neg Hx    Esophageal cancer Neg Hx    Stomach cancer Neg Hx    Rectal cancer Neg Hx     Social History   Tobacco Use   Smoking status: Never    Passive exposure: Never   Smokeless tobacco: Never  Vaping Use   Vaping Use: Never used  Substance Use Topics   Alcohol use: Yes    Alcohol/week: 4.0 standard drinks of alcohol    Types: 4 Glasses of wine per week    Comment: social daily- wine or beer   Drug use: No    ROS: As per history of present illness, otherwise negative  BP 122/68   Pulse 70   Ht 5\' 4"  (1.626 m)   Wt 133 lb 3.2 oz (60.4 kg)   SpO2 99%   BMI 22.86 kg/m  Gen: awake, alert, NAD HEENT: anicteric  CV: RRR, no mrg Pulm: CTA b/l Abd: soft, NT/ND, +BS throughout Ext: no c/c/e Neuro: nonfocal  RELEVANT LABS AND IMAGING: CBC    Component Value Date/Time   WBC 10.4 05/14/2017 1240   RBC 4.37 05/14/2017 1240   HGB 14.3 05/14/2017 1240   HCT 42.2 05/14/2017 1240   PLT 282 05/14/2017 1240   MCV 96.6 05/14/2017 1240   MCH 32.7 05/14/2017 1240   MCHC 33.9 05/14/2017 1240   RDW 12.5 05/14/2017 1240   LYMPHSABS 1.3 05/14/2017 1240   MONOABS 0.6 05/14/2017 1240   EOSABS 0.0 05/14/2017 1240   BASOSABS 0.0 05/14/2017 1240    CMP     Component Value Date/Time   NA 142  03/05/2022 1218   K 4.6 03/05/2022 1218   CL 102 03/05/2022 1218   CO2 27 03/05/2022 1218   GLUCOSE 77 03/05/2022 1218   GLUCOSE 113 (H) 05/14/2017 1240   BUN 12 03/05/2022 1218  CREATININE 0.75 03/05/2022 1218   CALCIUM 10.3 03/05/2022 1218   PROT 6.8 03/05/2022 1218   ALBUMIN 4.4 03/05/2022 1218   AST 19 03/05/2022 1218   ALT 12 03/05/2022 1218   ALKPHOS 82 03/05/2022 1218   BILITOT 0.4 03/05/2022 1218   GFRNONAA 78 05/25/2019 1039   GFRAA 90 05/25/2019 1039    ASSESSMENT/PLAN: 72 year old female with a history of colonic diverticulosis, osteoarthritis who is seen in consult at the request of Dr. Jennette Kettle to evaluate esophageal pain and mild dysphagia.   Pill esophagitis and dysphagia --symptoms consistent with the pill esophagitis occurring in January 2024.  Perhaps this inflammation led to some minor scarring and a stricture.  She does have some pill dysphagia that has persisted though overall seems better.  We discussed direct visualization with upper endoscopy versus barium esophagram.  Upper endoscopy could be both diagnostic and therapeutic in the event dilation as needed.  After discussing the risk, benefits and alternatives she is agreeable and wishes to proceed.  Given the lack of frequent reflux symptoms I am not starting PPI at this time. -- Upper endoscopy in the [facility]  2.  Colon cancer screening --she asked about Cologuard and we did discuss the risks and benefits of both Cologuard versus colonoscopy.  She had a full colonoscopy to the cecum with a good prep in November 2015.  She does not have family history of colon cancer.  She would be appropriate for repeat colonoscopy in November next year versus Cologuard every 3 years.  Right now she is leaning for colonoscopy after next November      ZO:XWRU, Roxine Caddy, Md [address] [place],  [place] [address]

## 2022-06-13 NOTE — Patient Instructions (Signed)
You have been scheduled for an endoscopy. Please follow written instructions given to you at your visit today. If you use inhalers (even only as needed), please bring them with you on the day of your procedure.  The [facility] providers would like to encourage you to use MYCHART to communicate with providers for non-urgent requests or questions.  Due to long hold times on the telephone, sending your provider a message by MYCHART may be a faster and more efficient way to get a response.  Please allow 48 business hours for a response.  Please remember that this is for non-urgent requests.   Due to recent changes in healthcare laws, you may see the results of your imaging and laboratory studies on MyChart before your provider has had a chance to review them.  We understand that in some cases there may be results that are confusing or concerning to you. Not all laboratory results come back in the same time frame and the provider may be waiting for multiple results in order to interpret others.  Please give us 48 hours in order for your provider to thoroughly review all the results before contacting the office for clarification of your results.   

## 2022-06-17 ENCOUNTER — Ambulatory Visit (INDEPENDENT_AMBULATORY_CARE_PROVIDER_SITE_OTHER): Payer: Medicare Other | Admitting: *Deleted

## 2022-06-17 DIAGNOSIS — Z0000 Encounter for general adult medical examination without abnormal findings: Secondary | ICD-10-CM | POA: Diagnosis not present

## 2022-06-17 NOTE — Progress Notes (Signed)
Subjective:   [name] is a 72 y.o. female who presents for Medicare Annual (Subsequent) preventive examination.  I connected with  Milon Score on 06/17/22 by a telephone enabled telemedicine application and verified that I am speaking with the correct person using two identifiers.   I discussed the limitations of evaluation and management by telemedicine. The patient expressed understanding and agreed to proceed.  Patient location: home  Provider location: telephone/home    Review of Systems     Cardiac Risk Factors include: advanced age (>2men, >46 women);family history of premature cardiovascular disease     Objective:    Today's Vitals   There is no height or weight on file to calculate BMI.     06/17/2022   11:47 AM 03/05/2022   11:12 AM 03/05/2022   11:06 AM 07/03/2021    2:03 PM 03/12/2021    4:00 PM 04/25/2020    9:52 AM 03/07/2019   10:38 AM  Advanced Directives  Does Patient Have a Medical Advance Directive? Yes Yes No Yes Yes No No  Type of Estate agent of [place];Living will Living will;Healthcare Power of [employer] Power of [place];Living will Healthcare Power of [place];Living will    Does patient want to make changes to medical advance directive?    No - Patient declined No - Patient declined    Copy of Healthcare Power of Attorney in Chart? Yes - validated most recent copy scanned in chart (See row information) Yes - validated most recent copy scanned in chart (See row information)  Yes - validated most recent copy scanned in chart (See row information) Yes - validated most recent copy scanned in chart (See row information)    Would patient like information on creating a medical advance directive?  No - Patient declined No - Patient declined   No - Patient declined No - Patient declined    Current Medications (verified) Outpatient Encounter Medications as of 06/17/2022  Medication Sig   ALPRAZolam (XANAX) 0.25 MG  tablet Take 1 tablet (0.25 mg total) by mouth daily as needed for anxiety.   azelastine (ASTELIN) 0.1 % nasal spray Place 1 spray into both nostrils daily. Use in each nostril as directed   baclofen (LIORESAL) 10 MG tablet Take one by mouth at bedtime as needed for back spasm   calcium citrate (CALCITRATE - DOSED IN MG ELEMENTAL CALCIUM) 950 (200 Ca) MG tablet Take 200 mg of elemental calcium by mouth daily.   cholecalciferol (VITAMIN D) 1000 units tablet Take 1,000 Units by mouth daily.   latanoprost (XALATAN) 0.005 % ophthalmic solution 1 drop at bedtime.   magnesium oxide (MAG-OX) 400 MG tablet Take 400 mg by mouth at bedtime.    Multiple Vitamin (MULTIVITAMIN WITH MINERALS) TABS tablet Take 1 tablet by mouth daily.   Omega-3 Fatty Acids (FISH OIL) 1000 MG CAPS Take by mouth daily.   psyllium (REGULOID) 0.52 G capsule Take 3 capsules by mouth 2 (two) times daily.    traMADol (ULTRAM) 50 MG tablet Take one or two at bedtime prn for low back pain   tretinoin (RETIN-A) 0.025 % cream Apply 1 application topically at bedtime.   vitamin E 400 UNIT capsule Take 400 Units by mouth daily.   zolpidem (AMBIEN) 5 MG tablet TAKE ONE TABLET BY MOUTH EVERY NIGHT AT BEDTIME AS NEEDED FOR SLEEP   No facility-administered encounter medications on file as of 06/17/2022.    Allergies (verified) Dilaudid [hydromorphone hcl], Fentanyl, Minocycline, and  Oxycodone   History: Past Medical History:  Diagnosis Date   Allergy    Arthritis    osteoarthritis, osteoporosis. Past hx. 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   Diverticulosis    H/O oophorectomy    '99   H/O tinnitus    bilateral mild   Hx of seasonal allergies    Past Surgical History:  Procedure Laterality Date   ABDOMINAL HYSTERECTOMY     '01   EYE SURGERY Left 01/2020   Cataract Removal   JOINT REPLACEMENT Right    '09- [facility]   KNEE ARTHROSCOPY Left     '07 meniscus   TOTAL ABDOMINAL HYSTERECTOMY W/ BILATERAL SALPINGOOPHORECTOMY     TOTAL HIP ARTHROPLASTY Left 08/06/2016   Procedure: LEFT TOTAL HIP ARTHROPLASTY ANTERIOR APPROACH;  Surgeon: Ollen Gross, MD;  Location: [facility];  Service: Orthopedics;  Laterality: Left;   TOTAL HIP REVISION Right 07/04/2013   Procedure: RIGHT TOTAL HIP REVISION;  Surgeon: Loanne Drilling, MD;  Location: [facility];  Service: Orthopedics;  Laterality: Right;   Family History  Problem Relation Age of Onset   Arthritis Mother    Arthritis Father    Glaucoma Father    Heart disease Brother    Hypertension Brother    Arthritis Brother    Colon cancer Neg Hx    Esophageal cancer Neg Hx    Stomach cancer Neg Hx    Rectal cancer Neg Hx    Social History   Socioeconomic History   Marital status: Married    Spouse name: Brett Canales   Number of children: 0   Years of education: 0   Highest education level: Bachelor's degree (e.g., BA, AB, BS)  Occupational History   Occupation: Retired  Tobacco Use   Smoking status: Never    Passive exposure: Never   Smokeless tobacco: Never  Vaping Use   Vaping Use: Never used  Substance and Sexual Activity   Alcohol use: Yes    Alcohol/week: 4.0 standard drinks of alcohol    Types: 4 Glasses of wine per week    Comment: social daily- wine or beer   Drug use: No   Sexual activity: Yes  Other Topics Concern   Not on file  Social History Narrative   Lives with her husband Brett Canales [facility] patient.)    No children.    Enjoys reading, hiking and gardening.    Patient exercises 5-6x per week. Swims, bikes and walks.    High fiber diet.    Social Determinants of Health   Financial Resource Strain: Low Risk  (03/12/2021)   Overall Financial Resource Strain (CARDIA)    Difficulty of Paying Living Expenses: Not hard at all  Food Insecurity: No Food Insecurity (03/12/2021)   Hunger Vital Sign    Worried About Running Out of Food in the Last Year: Never true    Ran Out of  Food in the Last Year: Never true  Transportation Needs: No Transportation Needs (03/12/2021)   PRAPARE - Administrator, Civil Service (Medical): No    Lack of Transportation (Non-Medical): No  Physical Activity: Sufficiently Active (03/12/2021)   Exercise Vital Sign    Days of Exercise per Week: 6 days    Minutes of Exercise per Session: 30 min  Stress: No Stress Concern Present (03/12/2021)   [employer] of Occupational Health - Occupational Stress Questionnaire    Feeling of Stress :  Not at all  Social Connections: Moderately Integrated (03/12/2021)   Social Connection and Isolation Panel [NHANES]    Frequency of Communication with Friends and Family: More than three times a week    Frequency of Social Gatherings with Friends and Family: More than three times a week    Attends Religious Services: Never    Database administrator or Organizations: Yes    Attends Engineer, structural: 1 to 4 times per year    Marital Status: Married    Tobacco Counseling Counseling given: Not Answered   Clinical Intake:  Pre-visit preparation completed: Yes  Pain : No/denies pain     Nutritional Risks: None Diabetes: No  How often do you need to have someone help you when you read instructions, pamphlets, or other written materials from your doctor or pharmacy?: 1 - Never  Diabetic? no  Interpreter Needed?: No  Information entered by :: Remi Haggard LPN   Activities of Daily Living    06/17/2022   11:49 AM 06/17/2022   11:47 AM  In your present state of health, do you have any difficulty performing the following activities:  Hearing? 0 0  Vision? 0 0  Difficulty concentrating or making decisions? 0 0  Walking or climbing stairs? 0 0  Dressing or bathing? 0 0  Doing errands, shopping? 0 0  Preparing Food and eating ? N N  Using the Toilet? N N  In the past six months, have you accidently leaked urine? N N  Do you have problems with loss of bowel control? N  N  Managing your Medications? N N  Managing your Finances? N N  Housekeeping or managing your Housekeeping? N N    Patient Care Team: Nestor Ramp, MD as PCP - General (Family Medicine) Ernesto Rutherford, MD as Referring Physician (Ophthalmology)  Indicate any recent Medical Services you may have received from other than [facility] providers in the past year (date may be approximate).     Assessment:   This is a routine wellness examination for [name].  Hearing/Vision screen Hearing Screening - Comments:: No trouble hearing tinnitus Vision Screening - Comments:: Up to date [facility] Cataracts surgery both eyes   Dietary issues and exercise activities discussed: Current Exercise Habits: Home exercise routine, Type of exercise: stretching;strength training/weights;walking (swimming), Time (Minutes): 60, Frequency (Times/Week): 5, Weekly Exercise (Minutes/Week): 300, Intensity: Moderate   Goals Addressed             This Visit's Progress    Patient Stated   On track    Maintain current status of physical activity.      Patient Stated       Continue current lifestye       Depression Screen    03/05/2022   11:15 AM 05/08/2021   11:35 AM 03/12/2021    3:59 PM 05/25/2019    8:57 AM 03/07/2019   10:39 AM 12/30/2017   11:01 AM 10/14/2017    9:59 AM  PHQ 2/9 Scores  PHQ - 2 Score 0 0 0 0 0 0 0  PHQ- 9 Score 1 2         Fall Risk    06/17/2022   11:39 AM 06/16/2022    6:06 PM 03/05/2022   11:11 AM 03/12/2021    4:01 PM 04/25/2020    9:52 AM  Fall Risk   Falls in the past year? 0 0 0 1 0  Number falls in past yr: 0 0  0  Injury with Fall? 0 0  0   Risk for fall due to :    No Fall Risks   Follow up Falls evaluation completed;Education provided;Falls prevention discussed   Falls prevention discussed     FALL RISK PREVENTION PERTAINING TO THE HOME:  Any stairs in or around the home? No  If so, are there any without handrails? No  Home free of loose throw rugs in walkways, pet  beds, electrical cords, etc? Yes  Adequate lighting in your home to reduce risk of falls? Yes   ASSISTIVE DEVICES UTILIZED TO PREVENT FALLS:  Life alert? No  Use of a cane, walker or w/c? No  Grab bars in the bathroom? Yes  Shower chair or bench in shower? No  Elevated toilet seat or a handicapped toilet? No   TIMED UP AND GO:  Was the test performed? No .    Cognitive Function:        06/17/2022   11:45 AM 03/12/2021    4:02 PM  6CIT Screen  What Year? 0 points 0 points  What month? 0 points 0 points  What time? 0 points 0 points  Count back from 20 0 points 0 points  Months in reverse 0 points 0 points  Repeat phrase 0 points 0 points  Total Score 0 points 0 points    Immunizations Immunization History  Administered Date(s) Administered   COVID-19, mRNA, vaccine(Comirnaty)12 years and older 10/13/2021   Fluad Quad(high Dose 65+) 10/01/2018   Hepatitis A, Adult 03/06/2021   Hepatitis B, ADULT 03/06/2021   Influenza Split 10/21/2019   Influenza-Unspecified 10/29/2017, 10/13/2020, 10/13/2021   PFIZER Comirnaty(Gray Top)Covid-19 Tri-Sucrose Vaccine 10/11/2019, 04/11/2020   PFIZER(Purple Top)SARS-COV-2 Vaccination 02/04/2019, 02/24/2019   Pfizer Covid-19 Vaccine Bivalent Booster 70yrs & up 09/24/2020, 05/08/2021   Pneumococcal Conjugate-13 05/25/2019   Pneumococcal Polysaccharide-23 01/15/2018   RSV,unspecified 11/13/2021   Td 05/14/2017   Zoster Recombinat (Shingrix) 10/15/2017, 12/26/2017    TDAP status: Up to date  Flu Vaccine status: Up to date  Pneumococcal vaccine status: Up to date  Covid-19 vaccine status: Information provided on how to obtain vaccines.   Qualifies for Shingles Vaccine? No   Zostavax completed Yes   Shingrix Completed?: Yes  Screening Tests Health Maintenance  Topic Date Due   Hepatitis C Screening  Never done   COVID-19 Vaccine (8 - 2023-24 season) 12/08/2021   INFLUENZA VACCINE  08/14/2022   Medicare Annual Wellness (AWV)   06/17/2023   Colonoscopy  12/07/2023   MAMMOGRAM  05/19/2024   DTaP/Tdap/Td (2 - Tdap) 05/15/2027   Pneumonia Vaccine 67+ Years old  Completed   DEXA SCAN  Completed   Zoster Vaccines- Shingrix  Completed   HPV VACCINES  Aged Out    Health Maintenance  Health Maintenance Due  Topic Date Due   Hepatitis C Screening  Never done   COVID-19 Vaccine (8 - 2023-24 season) 12/08/2021    Colorectal cancer screening: Type of screening: Colonoscopy. Completed 2015. Repeat every 10 years  Mammogram status: Completed 2024. Repeat every year  Bone Density status: Completed 2023. Results reflect: Bone density results: OSTEOPENIA. Repeat every 2 years.  Lung Cancer Screening: (Low Dose CT Chest recommended if Age 46-80 years, 30 pack-year currently smoking OR have quit w/in 15years.) does not qualify.   Lung Cancer Screening Referral:   Additional Screening:  Hepatitis C Screening never done  Vision Screening: Recommended annual ophthalmology exams for early detection of glaucoma and other disorders of the eye. Is the patient  up to date with their annual eye exam?  Yes  Who is the provider or what is the name of the office in which the patient attends annual eye exams? [facility] If pt is not established with a provider, would they like to be referred to a provider to establish care? No .   Dental Screening: Recommended annual dental exams for proper oral hygiene  Community Resource Referral / Chronic Care Management: CRR required this visit?  No   CCM required this visit?  No      Plan:     I have personally reviewed and noted the following in the patient's chart:   Medical and social history Use of alcohol, tobacco or illicit drugs  Current medications and supplements including opioid prescriptions. Patient is not currently taking opioid prescriptions. Functional ability and status Nutritional status Physical activity Advanced directives List of other  physicians Hospitalizations, surgeries, and ER visits in previous 12 months Vitals Screenings to include cognitive, depression, and falls Referrals and appointments  In addition, I have reviewed and discussed with patient certain preventive protocols, quality metrics, and best practice recommendations. A written personalized care plan for preventive services as well as general preventive health recommendations were provided to patient.     Remi Haggard, LPN   01/18/1094   Nurse Notes:

## 2022-06-25 ENCOUNTER — Encounter: Payer: Self-pay | Admitting: Family Medicine

## 2022-07-09 ENCOUNTER — Ambulatory Visit: Payer: Medicare Other | Admitting: Family Medicine

## 2022-07-09 ENCOUNTER — Encounter: Payer: Self-pay | Admitting: Family Medicine

## 2022-07-09 VITALS — BP 114/72 | HR 60 | Ht 64.0 in | Wt 135.4 lb

## 2022-07-09 DIAGNOSIS — M545 Low back pain, unspecified: Secondary | ICD-10-CM

## 2022-07-09 DIAGNOSIS — M160 Bilateral primary osteoarthritis of hip: Secondary | ICD-10-CM | POA: Diagnosis not present

## 2022-07-09 DIAGNOSIS — F418 Other specified anxiety disorders: Secondary | ICD-10-CM

## 2022-07-09 DIAGNOSIS — G4709 Other insomnia: Secondary | ICD-10-CM

## 2022-07-09 DIAGNOSIS — M810 Age-related osteoporosis without current pathological fracture: Secondary | ICD-10-CM

## 2022-07-09 DIAGNOSIS — R1310 Dysphagia, unspecified: Secondary | ICD-10-CM

## 2022-07-09 DIAGNOSIS — G8929 Other chronic pain: Secondary | ICD-10-CM

## 2022-07-09 DIAGNOSIS — Z0000 Encounter for general adult medical examination without abnormal findings: Secondary | ICD-10-CM

## 2022-07-09 MED ORDER — TRAMADOL HCL 50 MG PO TABS
ORAL_TABLET | ORAL | 2 refills | Status: DC
Start: 2022-07-09 — End: 2023-03-30

## 2022-07-09 MED ORDER — BACLOFEN 10 MG PO TABS
ORAL_TABLET | ORAL | 3 refills | Status: DC
Start: 2022-07-09 — End: 2023-08-19

## 2022-07-09 MED ORDER — MELOXICAM 15 MG PO TABS: ORAL_TABLET | ORAL | 3 refills | Status: AC

## 2022-07-09 MED ORDER — ALPRAZOLAM 0.25 MG PO TABS: 0.2500 mg | ORAL_TABLET | Freq: Every day | ORAL | 3 refills | Status: DC | PRN

## 2022-07-09 MED ORDER — ZOLPIDEM TARTRATE 5 MG PO TABS: ORAL_TABLET | ORAL | 3 refills | Status: DC

## 2022-07-09 NOTE — Patient Instructions (Signed)
When I see you back remind to check calcium level. Let me see you after your big trip;  to [place]!

## 2022-07-10 NOTE — Assessment & Plan Note (Signed)
Will refill for her upcoming air travel.

## 2022-07-10 NOTE — Assessment & Plan Note (Signed)
She has a follow-up appointment with gastroenterology for this.

## 2022-07-10 NOTE — Progress Notes (Signed)
    CHIEF COMPLAINT / HPI: Here for health maintenance visit.  Has some questions about her labs specifically about her cholesterol and her cardiovascular risk.  Also some questions about supplements.  Generally she is doing well.  Needs some refills.  Plans to go to [place] in a few months with her husband for an extended vacation.  They lived there for a while many years ago.  She has anxiety with flying so we will need refill on her alprazolam for that.  She also intermittently takes zolpidem for insomnia but knows not to take them together.  Evidently [place] is very strict about medications she will investigate whether or not she needs specific prescription with specific number of pills for that visit and let me know.   PERTINENT  PMH / PSH: I have reviewed the patient's medications, allergies, past medical and surgical history, smoking status and updated in the EMR as appropriate.   OBJECTIVE:  BP 114/72   Pulse 60   Ht 5\' 4"  (1.626 m)   Wt 135 lb 6.4 oz (61.4 kg)   SpO2 99%   BMI 23.24 kg/m  GENERAL: Well-developed, no acute distress PSYCH: AxOx4. Good eye contact.. No psychomotor retardation or agitation. Appropriate speech fluency and content. Asks and answers questions appropriately. Mood is congruent.   ASSESSMENT / PLAN:   Well adult exam Reviewed her health maintenance.  She is up-to-date.  Reviewed her labs as well.  She gets plenty of exercise.  Her cardiovascular 10-year risk is 6.6.  Addition of a statin would decrease that to 6.2.  Similarly addition of an aspirin would decrease from 6.6-6.2.  Situational anxiety Will refill for her upcoming air travel.  Osteoporosis without current pathological fracture Briefly discussed.  She is not interested in bisphosphonate.  Dysphagia She has a follow-up appointment with gastroenterology for this.   Denny Levy MD

## 2022-07-10 NOTE — Assessment & Plan Note (Signed)
Reviewed her health maintenance.  She is up-to-date.  Reviewed her labs as well.  She gets plenty of exercise.  Her cardiovascular 10-year risk is 6.6.  Addition of a statin would decrease that to 6.2.  Similarly addition of an aspirin would decrease from 6.6-6.2.

## 2022-07-10 NOTE — Assessment & Plan Note (Signed)
Briefly discussed.  She is not interested in bisphosphonate.

## 2022-08-08 ENCOUNTER — Ambulatory Visit (AMBULATORY_SURGERY_CENTER): Payer: Medicare Other | Admitting: Internal Medicine

## 2022-08-08 ENCOUNTER — Encounter: Payer: Self-pay | Admitting: Internal Medicine

## 2022-08-08 VITALS — BP 102/70 | HR 61 | Temp 98.4°F | Resp 13 | Ht 64.0 in | Wt 133.0 lb

## 2022-08-08 DIAGNOSIS — R1310 Dysphagia, unspecified: Secondary | ICD-10-CM | POA: Diagnosis not present

## 2022-08-08 DIAGNOSIS — T50905A Adverse effect of unspecified drugs, medicaments and biological substances, initial encounter: Secondary | ICD-10-CM

## 2022-08-08 DIAGNOSIS — K208 Other esophagitis without bleeding: Secondary | ICD-10-CM

## 2022-08-08 HISTORY — PX: UPPER GASTROINTESTINAL ENDOSCOPY: SHX188

## 2022-08-08 MED ORDER — SODIUM CHLORIDE 0.9 % IV SOLN: 500.0000 mL | Freq: Once | INTRAVENOUS | Status: AC

## 2022-08-08 NOTE — Progress Notes (Signed)
Called to room to assist during endoscopic procedure.  Patient ID and intended procedure confirmed with present staff. Received instructions for my participation in the procedure from the performing physician.  

## 2022-08-08 NOTE — Op Note (Signed)
[facility] Patient Name: [name] Procedure Date: 08/08/2022 9:24 AM MRN: [id number] Endoscopist: Beverley Fiedler , MD, [id number] Age: 72 Referring MD:  Date of Birth: 31-May-1950 Gender: Female Account #: [id number] Procedure:                Upper GI endoscopy Indications:              Episode of clinically consistent with pill                            esophagitis in Jan 2024, now with intermittent pill                            dysphagia Medicines:                Monitored Anesthesia Care Procedure:                Pre-Anesthesia Assessment:                           - Prior to the procedure, a History and Physical                            was performed, and patient medications and                            allergies were reviewed. The patient's tolerance of                            previous anesthesia was also reviewed. The risks                            and benefits of the procedure and the sedation                            options and risks were discussed with the patient.                            All questions were answered, and informed consent                            was obtained. Prior Anticoagulants: The patient has                            taken no anticoagulant or antiplatelet agents. ASA                            Grade Assessment: II - A patient with mild systemic                            disease. After reviewing the risks and benefits,                            the patient was deemed in satisfactory condition to  undergo the procedure.                           After obtaining informed consent, the endoscope was                            passed under direct vision. Throughout the                            procedure, the patient's blood pressure, pulse, and                            oxygen saturations were monitored continuously. The                            GIF [id number] #[id number] was introduced through the                             mouth, and advanced to the second part of duodenum.                            The upper GI endoscopy was accomplished without                            difficulty. The patient tolerated the procedure                            well. Scope In: Scope Out: Findings:                 The examined esophagus was normal.                           No endoscopic abnormality was evident in the                            esophagus to explain the patient's complaint of                            dysphagia. It was decided, however, to proceed with                            dilation of the entire esophagus. The scope was                            withdrawn. Dilation was performed with a Maloney                            dilator with mild resistance at 54 Fr.                           The entire examined stomach was normal.                           The examined duodenum was normal. Complications:  No immediate complications. Estimated Blood Loss:     Estimated blood loss: none. Impression:               - Normal esophagus.                           - Esophageal dilation with 54 Fr. Maloney.                           - Normal stomach.                           - Normal examined duodenum.                           - No specimens collected. Recommendation:           - Patient has a contact number available for                            emergencies. The signs and symptoms of potential                            delayed complications were discussed with the                            patient. Return to normal activities tomorrow.                            Written discharge instructions were provided to the                            patient.                           - Post-dilation diet and then advance diet as                            tolerated.                           - Continue present medications.                           - If persistent trouble  swallowing please contact                            my office for follow-up visit. Beverley Fiedler, MD 08/08/2022 9:46:52 AM This report has been signed electronically.

## 2022-08-08 NOTE — Progress Notes (Signed)
GASTROENTEROLOGY PROCEDURE H&P NOTE   Primary Care Physician: Nestor Ramp, MD    Reason for Procedure:  Pill esophagitis and dysphagia  Plan:    EGD with possible dilation  Patient is appropriate for endoscopic procedure(s) in the ambulatory ([facility]) setting.  The nature of the procedure, as well as the risks, benefits, and alternatives were carefully and thoroughly reviewed with the patient. Ample time for discussion and questions allowed. The patient understood, was satisfied, and agreed to proceed.     HPI: [name] is a 72 y.o. female who presents for EGD.  Medical history as below.  No recent chest pain or shortness of breath.  No abdominal pain today.  Past Medical History:  Diagnosis Date   Allergy    Arthritis    osteoarthritis, osteoporosis. Past hx. 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   Diverticulosis    H/O oophorectomy    '99   H/O tinnitus    bilateral mild   Hx of seasonal allergies     Past Surgical History:  Procedure Laterality Date   ABDOMINAL HYSTERECTOMY     '01   EYE SURGERY Left 01/2020   Cataract Removal   JOINT REPLACEMENT Right    '09- [facility]   KNEE ARTHROSCOPY Left    '07 meniscus   TOTAL ABDOMINAL HYSTERECTOMY W/ BILATERAL SALPINGOOPHORECTOMY     TOTAL HIP ARTHROPLASTY Left 08/06/2016   Procedure: LEFT TOTAL HIP ARTHROPLASTY ANTERIOR APPROACH;  Surgeon: Ollen Gross, MD;  Location: [facility];  Service: Orthopedics;  Laterality: Left;   TOTAL HIP REVISION Right 07/04/2013   Procedure: RIGHT TOTAL HIP REVISION;  Surgeon: Loanne Drilling, MD;  Location: [facility];  Service: Orthopedics;  Laterality: Right;   UPPER GASTROINTESTINAL ENDOSCOPY  08/08/2022    Prior to Admission medications   Medication Sig Start Date End Date Taking? Authorizing Provider  azelastine (ASTELIN) 0.1 % nasal spray Place 1 spray into both nostrils daily. Use in each  nostril as directed   Yes [provider]  baclofen (LIORESAL) 10 MG tablet Take one by mouth at bedtime as needed for back spasm 07/09/22  Yes Nestor Ramp, MD  Calcium Citrate-Vitamin D (CITRACAL + D PO) Take by mouth.   Yes [provider]  latanoprost (XALATAN) 0.005 % ophthalmic solution 1 drop at bedtime.   Yes [provider]  magnesium oxide (MAG-OX) 400 MG tablet Take 400 mg by mouth at bedtime.    Yes [provider]  Multiple Vitamin (MULTIVITAMIN WITH MINERALS) TABS tablet Take 1 tablet by mouth daily.   Yes [provider]  Omega-3 Fatty Acids (FISH OIL) 1000 MG CAPS Take by mouth daily.   Yes [provider]  psyllium (REGULOID) 0.52 G capsule Take 3 capsules by mouth 2 (two) times daily.    Yes [provider]  traMADol (ULTRAM) 50 MG tablet Take one or two at bedtime prn for low back pain 07/09/22  Yes Nestor Ramp, MD  tretinoin (RETIN-A) 0.025 % cream Apply 1 application topically at bedtime.   Yes [provider]  vitamin E 400 UNIT capsule Take 400 Units by mouth daily.   Yes [provider]  zolpidem (AMBIEN) 5 MG tablet TAKE ONE TABLET BY MOUTH EVERY NIGHT AT BEDTIME AS NEEDED FOR SLEEP do not use with alprazolam 07/09/22  Yes Nestor Ramp, MD  ALPRAZolam Prudy Feeler) 0.25 MG tablet  Take 1 tablet (0.25 mg total) by mouth daily as needed for anxiety. 07/09/22   Nestor Ramp, MD  meloxicam [facility]) 15 MG tablet Take one by mouth daily as needed 07/09/22   Nestor Ramp, MD    Current Outpatient Medications  Medication Sig Dispense Refill   azelastine (ASTELIN) 0.1 % nasal spray Place 1 spray into both nostrils daily. Use in each nostril as directed     baclofen (LIORESAL) 10 MG tablet Take one by mouth at bedtime as needed for back spasm 30 each 3   Calcium Citrate-Vitamin D (CITRACAL + D PO) Take by mouth.     latanoprost (XALATAN) 0.005 % ophthalmic solution 1 drop at bedtime.     magnesium oxide (MAG-OX)  400 MG tablet Take 400 mg by mouth at bedtime.      Multiple Vitamin (MULTIVITAMIN WITH MINERALS) TABS tablet Take 1 tablet by mouth daily.     Omega-3 Fatty Acids (FISH OIL) 1000 MG CAPS Take by mouth daily.     psyllium (REGULOID) 0.52 G capsule Take 3 capsules by mouth 2 (two) times daily.      traMADol (ULTRAM) 50 MG tablet Take one or two at bedtime prn for low back pain 30 tablet 2   tretinoin (RETIN-A) 0.025 % cream Apply 1 application topically at bedtime.     vitamin E 400 UNIT capsule Take 400 Units by mouth daily.     zolpidem (AMBIEN) 5 MG tablet TAKE ONE TABLET BY MOUTH EVERY NIGHT AT BEDTIME AS NEEDED FOR SLEEP do not use with alprazolam 30 tablet 3   ALPRAZolam (XANAX) 0.25 MG tablet Take 1 tablet (0.25 mg total) by mouth daily as needed for anxiety. 30 tablet 3   meloxicam (MOBIC) 15 MG tablet Take one by mouth daily as needed 90 tablet 3   Current Facility-Administered Medications  Medication Dose Route Frequency Provider Last Rate Last Admin   0.9 %  sodium chloride infusion  500 mL Intravenous Once Nalla Purdy, Carie Caddy, MD        Allergies as of 08/08/2022 - Review Complete 08/08/2022  Allergen Reaction Noted   Dilaudid [hydromorphone hcl] Nausea And Vomiting 06/29/2013   Fentanyl Nausea And Vomiting 06/29/2013   Minocycline Other (See Comments) 06/24/2013   Oxycodone Nausea And Vomiting 07/24/2016    Family History  Problem Relation Age of Onset   Arthritis Mother    Arthritis Father    Glaucoma Father    Heart disease Brother    Hypertension Brother    Arthritis Brother    Colon cancer Neg Hx    Esophageal cancer Neg Hx    Stomach cancer Neg Hx    Rectal cancer Neg Hx     Social History   Socioeconomic History   Marital status: Married    Spouse name: Brett Canales   Number of children: 0   Years of education: 0   Highest education level: Bachelor's degree (e.g., BA, AB, BS)  Occupational History   Occupation: Retired  Tobacco Use   Smoking status: Never     Passive exposure: Never   Smokeless tobacco: Never  Vaping Use   Vaping status: Never Used  Substance and Sexual Activity   Alcohol use: Yes    Alcohol/week: 4.0 standard drinks of alcohol    Types: 4 Glasses of wine per week    Comment: social daily- wine or beer   Drug use: No   Sexual activity: Yes    Birth control/protection: Post-menopausal  Other Topics  Concern   Not on file  Social History Narrative   Lives with her husband Brett Canales [facility] patient.)    No children.    Enjoys reading, hiking and gardening.    Patient exercises 5-6x per week. Swims, bikes and walks.    High fiber diet.    Social Determinants of Health   Financial Resource Strain: Low Risk  (03/12/2021)   Overall Financial Resource Strain (CARDIA)    Difficulty of Paying Living Expenses: Not hard at all  Food Insecurity: No Food Insecurity (06/17/2022)   Hunger Vital Sign    Worried About Running Out of Food in the Last Year: Never true    Ran Out of Food in the Last Year: Never true  Transportation Needs: No Transportation Needs (06/17/2022)   PRAPARE - Administrator, Civil Service (Medical): No    Lack of Transportation (Non-Medical): No  Physical Activity: Sufficiently Active (03/12/2021)   Exercise Vital Sign    Days of Exercise per Week: 6 days    Minutes of Exercise per Session: 30 min  Stress: No Stress Concern Present (03/12/2021)   [employer] of Occupational Health - Occupational Stress Questionnaire    Feeling of Stress : Not at all  Social Connections: Moderately Integrated (06/17/2022)   Social Connection and Isolation Panel [NHANES]    Frequency of Communication with Friends and Family: More than three times a week    Frequency of Social Gatherings with Friends and Family: More than three times a week    Attends Religious Services: Never    Database administrator or Organizations: Yes    Attends Engineer, structural: More than 4 times per year    Marital Status: Married   Catering manager Violence: Not At Risk (06/17/2022)   Humiliation, Afraid, Rape, and Kick questionnaire    Fear of Current or Ex-Partner: No    Emotionally Abused: No    Physically Abused: No    Sexually Abused: No    Physical Exam: Vital signs in last 24 hours: @BP  132/80   Pulse 64   Temp 98.4 F (36.9 C) (Temporal)   Ht 5\' 4"  (1.626 m)   Wt 133 lb (60.3 kg)   SpO2 98%   BMI 22.83 kg/m  GEN: NAD EYE: Sclerae anicteric ENT: MMM CV: Non-tachycardic Pulm: CTA b/l GI: Soft, NT/ND NEURO:  Alert & Oriented x 3   Erick Blinks, MD [facility]  08/08/2022 9:24 AM;

## 2022-08-08 NOTE — Progress Notes (Signed)
Pt's states no medical or surgical changes since previsit or office visit. 

## 2022-08-08 NOTE — Progress Notes (Signed)
Report to PACU, RN, vss, BBS= Clear.  

## 2022-08-08 NOTE — Patient Instructions (Addendum)
-   Post-dilation diet and then advance diet as tolerated. - Continue present medications. - If persistent trouble swallowing please contact my office for follow-up visit.  YOU HAD AN ENDOSCOPIC PROCEDURE TODAY AT THE [facility]:   Refer to the procedure report that was given to you for any specific questions about what was found during the examination.  If the procedure report does not answer your questions, please call your gastroenterologist to clarify.  If you requested that your care partner not be given the details of your procedure findings, then the procedure report has been included in a sealed envelope for you to review at your convenience later.  YOU SHOULD EXPECT: Some feelings of bloating in the abdomen. Passage of more gas than usual.  Walking can help get rid of the air that was put into your GI tract during the procedure and reduce the bloating. If you had a lower endoscopy (such as a colonoscopy or flexible sigmoidoscopy) you may notice spotting of blood in your stool or on the toilet paper. If you underwent a bowel prep for your procedure, you may not have a normal bowel movement for a few days.  Please Note:  You might notice some irritation and congestion in your nose or some drainage.  This is from the oxygen used during your procedure.  There is no need for concern and it should clear up in a day or so.  SYMPTOMS TO REPORT IMMEDIATELY:  Following upper endoscopy (EGD)  Vomiting of blood or coffee ground material  New chest pain or pain under the shoulder blades  Painful or persistently difficult swallowing  New shortness of breath  Fever of 100F or higher  Black, tarry-looking stools  For urgent or emergent issues, a gastroenterologist can be reached at any hour by calling [phone]. Do not use MyChart messaging for urgent concerns.    DIET:  We do recommend a small meal at first, but then you may proceed to your regular diet.  Drink plenty of fluids  but you should avoid alcoholic beverages for 24 hours.  ACTIVITY:  You should plan to take it easy for the rest of today and you should NOT DRIVE or use heavy machinery until tomorrow (because of the sedation medicines used during the test).    FOLLOW UP: Our staff will call the number listed on your records the next business day following your procedure.  We will call around 7:15- 8:00 am to check on you and address any questions or concerns that you may have regarding the information given to you following your procedure. If we do not reach you, we will leave a message.     If any biopsies were taken you will be contacted by phone or by letter within the next 1-3 weeks.  Please call us at [phone] if you have not heard about the biopsies in 3 weeks.    SIGNATURES/CONFIDENTIALITY: You and/or your care partner have signed paperwork which will be entered into your electronic medical record.  These signatures attest to the fact that that the information above on your After Visit Summary has been reviewed and is understood.  Full responsibility of the confidentiality of this discharge information lies with you and/or your care-partner.

## 2022-08-11 ENCOUNTER — Telehealth: Payer: Self-pay

## 2022-08-11 NOTE — Telephone Encounter (Signed)
Left message on follow up call. 

## 2022-09-24 ENCOUNTER — Encounter: Payer: Self-pay | Admitting: Family Medicine

## 2022-09-24 DIAGNOSIS — G8929 Other chronic pain: Secondary | ICD-10-CM

## 2022-10-21 ENCOUNTER — Ambulatory Visit: Payer: Medicare Other | Admitting: Family Medicine

## 2022-10-21 DIAGNOSIS — M549 Dorsalgia, unspecified: Secondary | ICD-10-CM | POA: Diagnosis not present

## 2022-10-21 DIAGNOSIS — G8929 Other chronic pain: Secondary | ICD-10-CM

## 2022-10-21 MED ORDER — TRIAMCINOLONE ACETONIDE 40 MG/ML IJ SUSP
40.0000 mg | Freq: Once | INTRAMUSCULAR | Status: AC
Start: 2022-10-21 — End: 2022-10-21
  Administered 2022-10-21: 40 mg via INTRAMUSCULAR

## 2022-10-21 NOTE — Progress Notes (Signed)
Here for trigger point injection (s) in back in prep for 14 hour plane ride to [place]. Previously discussed PROCEDURE: INJECTION: Patient was given informed consent, signed copy in the chart. Appropriate time out was taken. Area prepped and draped in usual sterile fashion. Ethyl chloride was  used for local anesthesia. A 21 gauge 1 1/2 inch needle was used.. 1/2cc of Kennalog 40 mg/ml plus  1/2  cc of 1% lidocaine without epinephrine was injected into the two trigger point areas in thoracic/lumbar area after pinpointing areas of maximum muscle tightness and pain. Total amount of Kennalog was 1 cc (40 mg / cc) and total lidocaine was 1 cc 1% without epinephrine.  The patient tolerated the procedure well. There were no complications. Post procedure instructions were given.

## 2022-10-21 NOTE — Patient Instructions (Signed)
Today you received an injection with corticosteroid. This injection is usually done in response to pain and inflammation. There is some "numbing" medicine also in the shot so the injected area may be numb and feel really good for the next couple of hours. The numbing medicine usually wears off in 2-3 hours though, and then your pain level will be right back where it was before the injection.   The actually benefit from the steroid injection is usually noticed in 2-7 days. You may actually experience a small (as in 10%) INCREASE in pain in the first 24 hours---that is common.   Things to watch out for that you should contact us or a health care provider urgently would include: 1. Unusual (as in more than 10%) increase in pain 2. New fever > 101.5 3. New swelling or redness of the injected area.  4. Streaking of red lines around the area injected.  

## 2023-01-16 ENCOUNTER — Encounter: Payer: Self-pay | Admitting: Family Medicine

## 2023-01-16 ENCOUNTER — Other Ambulatory Visit: Payer: Self-pay | Admitting: Family Medicine

## 2023-01-16 NOTE — Progress Notes (Signed)
 Thanks for reminding me! Yes, I did want to recheck it. Now would be a good time. I am putting in a lab order for you to come by and get it rechecked. No fasting necessary.   You do need to call for a lab appointment as some days we do not have a lab draw person here.    Please call for an appointment for lab only at your convenience. Happy New Year!

## 2023-01-19 ENCOUNTER — Other Ambulatory Visit: Payer: Medicare Other

## 2023-01-20 LAB — COMPREHENSIVE METABOLIC PANEL
ALT: 18 [IU]/L (ref 0–32)
AST: 29 [IU]/L (ref 0–40)
Albumin: 4.2 g/dL (ref 3.9–4.9)
Alkaline Phosphatase: 78 [IU]/L (ref 44–121)
BUN/Creatinine Ratio: 16 (ref 12–28)
BUN: 11 mg/dL (ref 8–27)
Bilirubin Total: 0.3 mg/dL (ref 0.0–1.2)
CO2: 27 mmol/L (ref 20–29)
Calcium: 9.6 mg/dL (ref 8.7–10.3)
Chloride: 103 mmol/L (ref 96–106)
Creatinine, Ser: 0.7 mg/dL (ref 0.57–1.00)
Globulin, Total: 2.5 g/dL (ref 1.5–4.5)
Glucose: 80 mg/dL (ref 70–99)
Potassium: 4.4 mmol/L (ref 3.5–5.2)
Sodium: 142 mmol/L (ref 134–144)
Total Protein: 6.7 g/dL (ref 6.0–8.5)
eGFR: 93 mL/min/{1.73_m2} (ref >59)

## 2023-01-20 LAB — PARATHYROID HORMONE, INTACT (NO CA): PTH: 35 pg/mL (ref 15–65)

## 2023-01-23 ENCOUNTER — Encounter: Payer: Self-pay | Admitting: Family Medicine

## 2023-03-29 ENCOUNTER — Other Ambulatory Visit: Payer: Self-pay | Admitting: Family Medicine

## 2023-03-29 DIAGNOSIS — M545 Low back pain, unspecified: Secondary | ICD-10-CM

## 2023-03-29 DIAGNOSIS — M160 Bilateral primary osteoarthritis of hip: Secondary | ICD-10-CM

## 2023-05-04 LAB — HM MAMMOGRAPHY

## 2023-08-10 ENCOUNTER — Ambulatory Visit: Payer: Medicare Other

## 2023-08-10 VITALS — Ht 64.0 in | Wt 132.0 lb

## 2023-08-10 DIAGNOSIS — Z0000 Encounter for general adult medical examination without abnormal findings: Secondary | ICD-10-CM

## 2023-08-10 NOTE — Progress Notes (Addendum)
 Because this visit was a virtual/telehealth visit,  certain criteria was not obtained, such a blood pressure, CBG if applicable, and timed get up and go. Any medications not marked as taking were not mentioned during the medication reconciliation part of the visit. Any vitals not documented were not able to be obtained due to this being a telehealth visit or patient was unable to self-report a recent blood pressure reading due to a lack of equipment at home via telehealth. Vitals that have been documented are verbally provided by the patient.   Subjective:   [name] is a 73 y.o. who presents for a Medicare Wellness preventive visit.  As a reminder, Annual Wellness Visits don't include a physical exam, and some assessments may be limited, especially if this visit is performed virtually. We may recommend an in-person follow-up visit with your provider if needed.  Visit Complete: Virtual I connected with  [name] on 08/10/23 by a audio enabled telemedicine application and verified that I am speaking with the correct person using two identifiers.  Patient Location: Home  Provider Location: Home Office  I discussed the limitations of evaluation and management by telemedicine. The patient expressed understanding and agreed to proceed.  Vital Signs: Because this visit was a virtual/telehealth visit, some criteria may be missing or patient reported. Any vitals not documented were not able to be obtained and vitals that have been documented are patient reported.  VideoDeclined- This patient declined Librarian, academic. Therefore the visit was completed with audio only.  Persons Participating in Visit: Patient.  AWV Questionnaire: Yes: Patient Medicare AWV questionnaire was completed by the patient on 08/10/2023; I have confirmed that all information answered by patient is correct and no changes since this date.  Cardiac Risk Factors include: advanced age  (>14men, >88 women);family history of premature cardiovascular disease     Objective:    Today's Vitals   08/10/23 1036 08/10/23 1059  Weight: 132 lb (59.9 kg)   Height: 5' 4 (1.626 m)   PainSc: 0-No pain 0-No pain   Body mass index is 22.66 kg/m.     08/10/2023   10:39 AM 06/17/2022   11:47 AM 03/05/2022   11:12 AM 03/05/2022   11:06 AM 07/03/2021    2:03 PM 03/12/2021    4:00 PM 04/25/2020    9:52 AM  Advanced Directives  Does Patient Have a Medical Advance Directive? Yes Yes Yes No Yes Yes No  Type of Estate agent of [place];Living will Healthcare Power of [place];Living will Living will;Healthcare Power of [employer] Power of [place];Living will Healthcare Power of [place];Living will   Does patient want to make changes to medical advance directive? No - Patient declined    No - Patient declined No - Patient declined   Copy of Healthcare Power of Attorney in Chart? Yes - validated most recent copy scanned in chart (See row information) Yes - validated most recent copy scanned in chart (See row information) Yes - validated most recent copy scanned in chart (See row information)  Yes - validated most recent copy scanned in chart (See row information) Yes - validated most recent copy scanned in chart (See row information)   Would patient like information on creating a medical advance directive?   No - Patient declined No - Patient declined   No - Patient declined    Current Medications (verified) Outpatient Encounter Medications as of 08/10/2023  Medication Sig   ALPRAZolam  (XANAX ) 0.25  MG tablet Take 1 tablet (0.25 mg total) by mouth daily as needed for anxiety.   azelastine (ASTELIN) 0.1 % nasal spray Place 1 spray into both nostrils daily. Use in each nostril as directed   baclofen  (LIORESAL ) 10 MG tablet Take one by mouth at bedtime as needed for back spasm   Calcium Citrate-Vitamin D (CITRACAL + D PO) Take by mouth.   latanoprost (XALATAN) 0.005  % ophthalmic solution 1 drop at bedtime.   magnesium oxide (MAG-OX) 400 MG tablet Take 400 mg by mouth at bedtime.    meloxicam  (MOBIC ) 15 MG tablet Take one by mouth daily as needed   Multiple Vitamin (MULTIVITAMIN WITH MINERALS) TABS tablet Take 1 tablet by mouth daily.   Omega-3 Fatty Acids (FISH OIL) 1000 MG CAPS Take by mouth daily.   psyllium (REGULOID) 0.52 G capsule Take 3 capsules by mouth 2 (two) times daily.    traMADol  (ULTRAM ) 50 MG tablet TAKE ONE TO TWO TABLETS BY MOUTH AT BEDTIME AS NEEDED FOR LOWER BACK PAIN   tretinoin (RETIN-A) 0.025 % cream Apply 1 application topically at bedtime.   vitamin E 400 UNIT capsule Take 400 Units by mouth daily.   zolpidem  (AMBIEN ) 5 MG tablet TAKE ONE TABLET BY MOUTH EVERY NIGHT AT BEDTIME AS NEEDED FOR SLEEP do not use with alprazolam    Facility-Administered Encounter Medications as of 08/10/2023  Medication   0.9 %  sodium chloride  infusion    Allergies (verified) Dilaudid  [hydromorphone  hcl], Fentanyl , Minocycline, and Oxycodone    History: Past Medical History:  Diagnosis Date   Allergy    Arthritis    osteoarthritis, osteoporosis. Past hx. 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   Diverticulosis    H/O oophorectomy    '99   H/O tinnitus    bilateral mild   Hx of seasonal allergies    Past Surgical History:  Procedure Laterality Date   ABDOMINAL HYSTERECTOMY     '01   EYE SURGERY Left 01/2020   Cataract Removal   JOINT REPLACEMENT Right    '09- [facility]   KNEE ARTHROSCOPY Left    '07 meniscus   TOTAL ABDOMINAL HYSTERECTOMY W/ BILATERAL SALPINGOOPHORECTOMY     TOTAL HIP ARTHROPLASTY Left 08/06/2016   Procedure: LEFT TOTAL HIP ARTHROPLASTY ANTERIOR APPROACH;  Surgeon: Melodi Lerner, MD;  Location: [facility];  Service: Orthopedics;  Laterality: Left;   TOTAL HIP REVISION Right 07/04/2013   Procedure: RIGHT TOTAL HIP REVISION;  Surgeon: Lerner Melodi GAILS, MD;  Location: [facility];  Service: Orthopedics;  Laterality: Right;   UPPER GASTROINTESTINAL ENDOSCOPY  08/08/2022   Family History  Problem Relation Age of Onset   Arthritis Mother    Arthritis Father    Glaucoma Father    Heart disease Brother    Hypertension Brother    Arthritis Brother    Colon cancer Neg Hx    Esophageal cancer Neg Hx    Stomach cancer Neg Hx    Rectal cancer Neg Hx    Social History   Socioeconomic History   Marital status: Married    Spouse name: Marcey   Number of children: 0   Years of education: 0   Highest education level: Bachelor's degree (e.g., BA, AB, BS)  Occupational History   Occupation: Retired  Tobacco Use   Smoking status: Never    Passive exposure: Never   Smokeless tobacco: Never  Vaping Use  Vaping status: Never Used  Substance and Sexual Activity   Alcohol use: Yes    Alcohol/week: 4.0 standard drinks of alcohol    Types: 4 Glasses of wine per week    Comment: social daily- wine or beer   Drug use: No   Sexual activity: Yes    Birth control/protection: Post-menopausal  Other Topics Concern   Not on file  Social History Narrative   Lives with her husband Marcey [facility] patient.)    No children.    Enjoys reading, hiking and gardening.    Patient exercises 5-6x per week. Swims, bikes and walks.    High fiber diet.    Social Drivers of Corporate investment banker Strain: Low Risk  (08/10/2023)   Overall Financial Resource Strain (CARDIA)    Difficulty of Paying Living Expenses: Not hard at all  Food Insecurity: No Food Insecurity (08/10/2023)   Hunger Vital Sign    Worried About Running Out of Food in the Last Year: Never true    Ran Out of Food in the Last Year: Never true  Transportation Needs: No Transportation Needs (08/10/2023)   PRAPARE - Administrator, Civil Service (Medical): No    Lack of Transportation (Non-Medical): No  Physical Activity: Sufficiently Active (08/10/2023)   Exercise Vital Sign     Days of Exercise per Week: 6 days    Minutes of Exercise per Session: 30 min  Stress: No Stress Concern Present (08/10/2023)   [employer] of Occupational Health - Occupational Stress Questionnaire    Feeling of Stress: Only a little  Social Connections: Moderately Integrated (08/10/2023)   Social Connection and Isolation Panel    Frequency of Communication with Friends and Family: More than three times a week    Frequency of Social Gatherings with Friends and Family: Three times a week    Attends Religious Services: Never    Active Member of Clubs or Organizations: Yes    Attends Engineer, structural: More than 4 times per year    Marital Status: Married    Tobacco Counseling Counseling given: Not Answered    Clinical Intake:  Pre-visit preparation completed: Yes  Pain : No/denies pain Pain Score: 0-No pain     BMI - recorded: 22.66 Nutritional Status: BMI of 19-24  Normal Nutritional Risks: None Diabetes: No  No results found for: HGBA1C   How often do you need to have someone help you when you read instructions, pamphlets, or other written materials from your doctor or pharmacy?: 1 - Never What is the last grade level you completed in school?: Bachelor's Degree  Interpreter Needed?: No  Information entered by :: Helmer Dull N. Devan Babino, LPN.   Activities of Daily Living     08/10/2023   11:01 AM 08/10/2023    8:17 AM  In your present state of health, do you have any difficulty performing the following activities:  Hearing? 0 0  Vision? 0 0  Difficulty concentrating or making decisions? 0 0  Walking or climbing stairs? 0 0  Dressing or bathing? 0 0  Doing errands, shopping? 0 0  Preparing Food and eating ? N N  Using the Toilet? N N  In the past six months, have you accidently leaked urine? N N  Do you have problems with loss of bowel control? N N  Managing your Medications? N N  Managing your Finances? N N  Housekeeping or managing your  Housekeeping? N N    Patient Care Team:  Rosalynn Camie CROME, MD as PCP - General (Family Medicine) Octavia Charleston, MD as Referring Physician (Ophthalmology)  I have updated your Care Teams any recent Medical Services you may have received from other providers in the past year.     Assessment:   This is a routine wellness examination for [name].  Hearing/Vision screen Hearing Screening - Comments:: Denies hearing difficulties.  Vision Screening - Comments:: No rx glasses - up to date with routine eye exams with Lonni Octavia, MD. Being treated for glaucoma.    Goals Addressed             This Visit's Progress    08/10/2023: Maintain my fitness goals.         Depression Screen     08/10/2023   10:39 AM 07/09/2022   11:16 AM 06/17/2022   11:51 AM 03/05/2022   11:15 AM 05/08/2021   11:35 AM 03/12/2021    3:59 PM 05/25/2019    8:57 AM  PHQ 2/9 Scores  PHQ - 2 Score 0 0 0 0 0 0 0  PHQ- 9 Score 1 1 0 1 2      Fall Risk     08/10/2023   11:01 AM 08/10/2023    8:17 AM 07/09/2022   11:15 AM 06/17/2022   11:39 AM 06/16/2022    6:06 PM  Fall Risk   Falls in the past year? 0 0 0 0 0  Number falls in past yr: 0 0 0 0 0  Injury with Fall? 0 0 0 0 0  Risk for fall due to : No Fall Risks      Follow up Falls evaluation completed  Falls evaluation completed Falls evaluation completed;Education provided;Falls prevention discussed     MEDICARE RISK AT HOME:  Medicare Risk at Home Any stairs in or around the home?: Yes If so, are there any without handrails?: No Home free of loose throw rugs in walkways, pet beds, electrical cords, etc?: Yes Adequate lighting in your home to reduce risk of falls?: Yes Life alert?: No Use of a cane, walker or w/c?: No Grab bars in the bathroom?: Yes Shower chair or bench in shower?: No Elevated toilet seat or a handicapped toilet?: No  TIMED UP AND GO:  Was the test performed?  No  Cognitive Function: 6CIT completed    08/10/2023   10:39 AM   MMSE - Mini Mental State Exam  Not completed: Unable to complete        08/10/2023   11:00 AM 06/17/2022   11:45 AM 03/12/2021    4:02 PM  6CIT Screen  What Year? 0 points 0 points 0 points  What month? 0 points 0 points 0 points  What time? 0 points 0 points 0 points  Count back from 20 0 points 0 points 0 points  Months in reverse 0 points 0 points 0 points  Repeat phrase 0 points 0 points 0 points  Total Score 0 points 0 points 0 points    Immunizations Immunization History  Administered Date(s) Administered   Fluad Quad(high Dose 65+) 10/01/2018   Fluzone Influenza virus vaccine,trivalent (IIV3), split virus 10/21/2019   Hepatitis A, Adult 03/06/2021   Hepatitis B, ADULT 03/06/2021   Influenza-Unspecified 10/29/2017, 10/13/2020, 10/13/2021   PFIZER Comirnaty(Gray Top)Covid-19 Tri-Sucrose Vaccine 10/11/2019, 04/11/2020   PFIZER(Purple Top)SARS-COV-2 Vaccination 02/04/2019, 02/24/2019   Pfizer Covid-19 Vaccine Bivalent Booster 84yrs & up 09/24/2020, 05/08/2021   Pfizer(Comirnaty)Fall Seasonal Vaccine 12 years and older 10/13/2021   Pneumococcal Conjugate-13 05/25/2019  Pneumococcal Polysaccharide-23 01/15/2018   RSV,unspecified 11/13/2021   Td 05/14/2017   Zoster Recombinant(Shingrix) 10/15/2017, 12/26/2017    Screening Tests Health Maintenance  Topic Date Due   Hepatitis C Screening  Never done   Hepatitis B Vaccines (2 of 3 - 19+ 3-dose series) 04/03/2021   COVID-19 Vaccine (8 - 2024-25 season) 09/14/2022   Medicare Annual Wellness (AWV)  06/17/2023   Colonoscopy  12/07/2023   INFLUENZA VACCINE  08/14/2023   MAMMOGRAM  05/03/2025   DTaP/Tdap/Td (2 - Tdap) 05/15/2027   Pneumococcal Vaccine: 50+ Years  Completed   DEXA SCAN  Completed   Zoster Vaccines- Shingrix  Completed   HPV VACCINES  Aged Out   Meningococcal B Vaccine  Aged Out    Health Maintenance  Health Maintenance Due  Topic Date Due   Hepatitis C Screening  Never done   Hepatitis B Vaccines (2  of 3 - 19+ 3-dose series) 04/03/2021   COVID-19 Vaccine (8 - 2024-25 season) 09/14/2022   Medicare Annual Wellness (AWV)  06/17/2023   Colonoscopy  12/07/2023   Health Maintenance Items Addressed: Yes Patient aware of current care gaps.  Immunization record was verified by [facility] and updated in patient's chart.  Additional Screening:  Vision Screening: Recommended annual ophthalmology exams for early detection of glaucoma and other disorders of the eye. Would you like a referral to an eye doctor? No    Dental Screening: Recommended annual dental exams for proper oral hygiene  Community Resource Referral / Chronic Care Management: CRR required this visit?  No   CCM required this visit?  No   Plan:    I have personally reviewed and noted the following in the patient's chart:   Medical and social history Use of alcohol, tobacco or illicit drugs  Current medications and supplements including opioid prescriptions. Patient is not currently taking opioid prescriptions. Functional ability and status Nutritional status Physical activity Advanced directives List of other physicians Hospitalizations, surgeries, and ER visits in previous 12 months Vitals Screenings to include cognitive, depression, and falls Referrals and appointments  In addition, I have reviewed and discussed with patient certain preventive protocols, quality metrics, and best practice recommendations. A written personalized care plan for preventive services as well as general preventive health recommendations were provided to patient.   Roz LOISE Fuller, LPN   [id number]   After Visit Summary: (MyChart) Due to this being a telephonic visit, the after visit summary with patients personalized plan was offered to patient via MyChart   Notes: Nothing significant to report at this time.

## 2023-08-10 NOTE — Patient Instructions (Signed)
 Ms. [name] , Thank you for taking time out of your busy schedule to complete your Annual Wellness Visit with me. I enjoyed our conversation and look forward to speaking with you again next year. I, as well as your care team,  appreciate your ongoing commitment to your health goals. Please review the following plan we discussed and let me know if I can assist you in the future. Your Game plan/ To Do List    Referrals: If you haven't heard from the office you've been referred to, please reach out to them at the phone provided.   Follow up Visits: Next Medicare AWV with our clinical staff: 08/11/2024 at 10:30 a.m. phone visit with Nurse Health Advisor.   Have you seen your provider in the last 6 months (3 months if uncontrolled diabetes)? Yes Next Office Visit with your provider: 08/19/2023 at 11:10 a.m. physical exam with Dr. Rosalynn  Clinician Recommendations:  Aim for 30 minutes of exercise or brisk walking, 6-8 glasses of water , and 5 servings of fruits and vegetables each day.       This is a list of the screening recommended for you and due dates:  Health Maintenance  Topic Date Due   Hepatitis C Screening  Never done   Hepatitis B Vaccine (2 of 3 - 19+ 3-dose series) 04/03/2021   COVID-19 Vaccine (8 - 2024-25 season) 09/14/2022   Medicare Annual Wellness Visit  06/17/2023   Colon Cancer Screening  12/07/2023   Flu Shot  08/14/2023   Mammogram  05/03/2025   DTaP/Tdap/Td vaccine (2 - Tdap) 05/15/2027   Pneumococcal Vaccine for age over 76  Completed   DEXA scan (bone density measurement)  Completed   Zoster (Shingles) Vaccine  Completed   HPV Vaccine  Aged Out   Meningitis B Vaccine  Aged Out    Advanced directives: (In Chart) A copy of your advanced directives are scanned into your chart should your provider ever need it. Advance Care Planning is important because it:  [x]  Makes sure you receive the medical care that is consistent with your values, goals, and preferences  [x]  It  provides guidance to your family and loved ones and reduces their decisional burden about whether or not they are making the right decisions based on your wishes.  Follow the link provided in your after visit summary or read over the paperwork we have mailed to you to help you started getting your Advance Directives in place. If you need assistance in completing these, please reach out to us  so that we can help you!  See attachments for Preventive Care and Fall Prevention Tips.

## 2023-08-19 ENCOUNTER — Encounter: Payer: Self-pay | Admitting: Family Medicine

## 2023-08-19 ENCOUNTER — Ambulatory Visit: Admitting: Family Medicine

## 2023-08-19 DIAGNOSIS — G8929 Other chronic pain: Secondary | ICD-10-CM

## 2023-08-19 DIAGNOSIS — M545 Low back pain, unspecified: Secondary | ICD-10-CM

## 2023-08-19 MED ORDER — BACLOFEN 10 MG PO TABS
ORAL_TABLET | ORAL | 5 refills | Status: AC
Start: 2023-08-19 — End: ?

## 2023-08-20 NOTE — Progress Notes (Signed)
    CHIEF COMPLAINT / HPI: Here for well adult health check.  Doing well.  Has some questions about health maintenance.  She continues to be very active.   PERTINENT  PMH / PSH: I have reviewed the patient's medications, allergies, past medical and surgical history, smoking status and updated in the EMR as appropriate.   OBJECTIVE:  BP 120/80   Pulse 63   Ht 5' 4 (1.626 m)   Wt 132 lb 3.2 oz (60 kg)   SpO2 100%   BMI 22.69 kg/m   Vital signs reviewed. GENERAL: Well-developed, well-nourished, no acute distress. CARDIOVASCULAR: Regular rate and rhythm no murmur gallop or rub LUNGS: Clear to auscultation bilaterally, no rales or wheeze. ABDOMEN: Soft positive bowel sounds NEURO: No gross focal neurological deficits. MSK: Movement of extremity x 4. PSYCH: AxOx4. Good eye contact.. No psychomotor retardation or agitation. Appropriate speech fluency and content. Asks and answers questions appropriately. Mood is congruent.  ASSESSMENT / PLAN:  Well adult health check: Extremely healthy and her health maintenance is up-to-date.  Urged her to continue following good diet and her active lifestyle.  Discussed influenza and possibly COVID booster in the fall. No problem-specific Assessment & Plan notes found for this encounter.   Camie Mulch MD

## 2023-08-28 ENCOUNTER — Encounter: Payer: Self-pay | Admitting: Family Medicine

## 2023-10-26 ENCOUNTER — Encounter: Payer: Self-pay | Admitting: Family Medicine

## 2023-11-04 LAB — FECAL OCCULT BLOOD, IMMUNOCHEMICAL: Fecal Globin Immuno: NEGATIVE

## 2023-11-11 ENCOUNTER — Other Ambulatory Visit: Payer: Self-pay | Admitting: Family Medicine

## 2023-11-11 DIAGNOSIS — G4709 Other insomnia: Secondary | ICD-10-CM

## 2023-11-13 NOTE — Progress Notes (Signed)
 [name]                                          MRN: [id number]   11/13/2023   The [facility] Quality Team Specialist reviewed this patient medical record for the purposes of chart review for care gap closure. The following were reviewed: chart review for care gap closure-colorectal cancer screening.    [facility] Quality Team

## 2023-11-27 ENCOUNTER — Encounter: Payer: Self-pay | Admitting: Family Medicine

## 2023-11-27 DIAGNOSIS — M810 Age-related osteoporosis without current pathological fracture: Secondary | ICD-10-CM

## 2024-01-12 ENCOUNTER — Other Ambulatory Visit: Payer: Self-pay | Admitting: Family Medicine

## 2024-01-20 ENCOUNTER — Encounter: Payer: Self-pay | Admitting: Family Medicine

## 2024-01-20 ENCOUNTER — Ambulatory Visit (INDEPENDENT_AMBULATORY_CARE_PROVIDER_SITE_OTHER): Admitting: Family Medicine

## 2024-01-20 VITALS — BP 115/71 | HR 70 | Ht 64.0 in | Wt 134.0 lb

## 2024-01-20 DIAGNOSIS — M5417 Radiculopathy, lumbosacral region: Secondary | ICD-10-CM | POA: Diagnosis not present

## 2024-01-20 DIAGNOSIS — Z0000 Encounter for general adult medical examination without abnormal findings: Secondary | ICD-10-CM

## 2024-01-20 NOTE — Patient Instructions (Signed)
 If you have not heard from radiology or your insurance in 7-10 days either call me or MyChart me a message and we will touch base.

## 2024-01-21 ENCOUNTER — Ambulatory Visit: Payer: Self-pay | Admitting: Family Medicine

## 2024-01-21 LAB — COMPREHENSIVE METABOLIC PANEL WITH GFR
ALT: 17 IU/L (ref 0–32)
AST: 23 IU/L (ref 0–40)
Albumin: 4.5 g/dL (ref 3.8–4.8)
Alkaline Phosphatase: 87 IU/L (ref 49–135)
BUN/Creatinine Ratio: 15 (ref 12–28)
BUN: 11 mg/dL (ref 8–27)
Bilirubin Total: 0.6 mg/dL (ref 0.0–1.2)
CO2: 28 mmol/L (ref 20–29)
Calcium: 10.3 mg/dL (ref 8.7–10.3)
Chloride: 101 mmol/L (ref 96–106)
Creatinine, Ser: 0.73 mg/dL (ref 0.57–1.00)
Globulin, Total: 2.4 g/dL (ref 1.5–4.5)
Glucose: 82 mg/dL (ref 70–99)
Potassium: 4.8 mmol/L (ref 3.5–5.2)
Sodium: 142 mmol/L (ref 134–144)
Total Protein: 6.9 g/dL (ref 6.0–8.5)
eGFR: 88 mL/min/1.73

## 2024-01-21 NOTE — Progress Notes (Signed)
Normal letter sent

## 2024-01-21 NOTE — Progress Notes (Signed)
" ° °  Discussed the use of AI scribe software for clinical note transcription with the patient, who gave verbal consent to proceed.  History of Present Illness   [name] is a 74 year old female for:  her regular well adult physical and healh maintenance check up  Issue: Also has a significant problem with her back, new symptoms of radiculopathy and new severity of what was previously intermittent mild low back pain..  Issue: Lumbar back pain flare with radiation into leg L>R - This flair Severe left-sided back pain ongoing for 8 weeks - Pain radiates into the left buttock and wraps around the hip - Pain is severe enough to wake her at night - Ambulation is difficult due to pain -  Has had this intermittent lumbar pain long time, but this flair is the worst ever, and radiculopathy is now much much worse. Also occasionally having radicular pain on right which is new sx Has completed formal physical therapy several tiems in past and restarted the program on her own at home 6 weeks ago, right when flair started Ion past this has been helpful, but this time is not having any relief. In fact sx seem to be worsening.  - No history of back surgery - Reluctant to consider surgical intervention due to a friend's adverse outcome - Performs daily home physical therapy exercises for back pain now for 6 weeks     PERTINENT  PMH / PSH: I have reviewed the patients medications, allergies, past medical and surgical history, smoking status.  Pertinent findings that relate to today's visit / issues include: (s/p bilateral THR)  Physical Exam   CHEST: Clear to auscultation bilaterally. CARDIOVASCULAR: Peripheral pulses strong bilaterally. Vital signs reviewed. GENERAL: Well-developed, well-nourished, no acute distress. LUNGS: Clear to auscultation bilaterally, no rales or wheeze. ABDOMEN: Soft positive bowel sounds NEURO: No gross focal neurological deficits. MSK: Movement of extremity x 4. BACK:  nontender to palpation.  Positive SLR on left in seated and supine position GAIT: slightly antalgic initially when she arises from chair.     Assessment and Plan    Well adult check up  Glaucoma Mild glaucoma, well-managed with regular ophthalmologic follow-up. - Continue regular ophthalmologic follow-up twice a year.  General Health Maintenance Routine health maintenance is up to date. Recent flu vaccination received. Cologuard test completed. Cholesterol levels suboptimal but not a current concern. DEXA scan scheduled. - Continue annual FIT testing. - Continue routine health maintenance and screenings.  Her regular exercise routine is suffering since back pain flair. Discussed  2. Lumbar back pain with radiculopathy Significant increase in severity of sx as well as now occasionally occurring on right side (new) Interfering with sleep and activities S/p 6 weeks of PT with actual worsening of sx (usually this improves her sx) Concern for lumbar spinal stenosis or centrally protruding HNP (disc). Need MRI ASAP           "

## 2024-02-03 ENCOUNTER — Ambulatory Visit
Admission: RE | Admit: 2024-02-03 | Discharge: 2024-02-03 | Disposition: A | Source: Ambulatory Visit | Attending: Family Medicine | Admitting: Family Medicine

## 2024-02-03 DIAGNOSIS — M5417 Radiculopathy, lumbosacral region: Secondary | ICD-10-CM

## 2024-02-06 ENCOUNTER — Encounter: Payer: Self-pay | Admitting: Family Medicine

## 2024-02-08 ENCOUNTER — Encounter: Payer: Self-pay | Admitting: Family Medicine

## 2024-02-10 ENCOUNTER — Other Ambulatory Visit: Payer: Self-pay | Admitting: Family Medicine

## 2024-02-10 ENCOUNTER — Telehealth: Payer: Self-pay | Admitting: Family Medicine

## 2024-02-10 DIAGNOSIS — M5417 Radiculopathy, lumbosacral region: Secondary | ICD-10-CM

## 2024-02-10 NOTE — Telephone Encounter (Signed)
 Phone discussion MRI results We decided to try epidural steroid inj first and if no improvement then send to [facility]. I do not think surgery would be next initial best step and we both would like to avoid if at all possible. Also sending referral to PT at her request will use [facility] as she has been there before. Answered all questions

## 2024-02-18 ENCOUNTER — Other Ambulatory Visit

## 2024-02-19 ENCOUNTER — Inpatient Hospital Stay: Admission: RE | Admit: 2024-02-19 | Source: Ambulatory Visit

## 2024-02-19 DIAGNOSIS — M5417 Radiculopathy, lumbosacral region: Secondary | ICD-10-CM

## 2024-02-19 MED ORDER — METHYLPREDNISOLONE ACETATE 40 MG/ML INJ SUSP (RADIOLOG
80.0000 mg | Freq: Once | INTRAMUSCULAR | Status: AC
  Administered 2024-02-19: 80 mg via EPIDURAL

## 2024-02-19 MED ORDER — IOPAMIDOL (ISOVUE-M 200) INJECTION 41%
1.0000 mL | Freq: Once | INTRAMUSCULAR | Status: AC
  Administered 2024-02-19: 1 mL via EPIDURAL

## 2024-02-19 NOTE — Discharge Instructions (Signed)

## 2024-02-24 ENCOUNTER — Other Ambulatory Visit

## 2024-06-08 ENCOUNTER — Ambulatory Visit (HOSPITAL_BASED_OUTPATIENT_CLINIC_OR_DEPARTMENT_OTHER): Payer: Self-pay

## 2024-08-11 ENCOUNTER — Encounter
